# Patient Record
Sex: Female | Born: 1975 | Race: White | Hispanic: No | Marital: Single | Smoking: Former smoker
Health system: Southern US, Community
[De-identification: ages and names within clinical notes are randomized; demographics above are authoritative.]

## PROBLEM LIST (undated history)

## (undated) DIAGNOSIS — E079 Disorder of thyroid, unspecified: Secondary | ICD-10-CM

## (undated) DIAGNOSIS — D6851 Activated protein C resistance: Secondary | ICD-10-CM

## (undated) DIAGNOSIS — Z8619 Personal history of other infectious and parasitic diseases: Secondary | ICD-10-CM

## (undated) DIAGNOSIS — I2699 Other pulmonary embolism without acute cor pulmonale: Secondary | ICD-10-CM

## (undated) DIAGNOSIS — E039 Hypothyroidism, unspecified: Secondary | ICD-10-CM

## (undated) HISTORY — DX: Activated protein C resistance: D68.51

## (undated) HISTORY — DX: Hypothyroidism, unspecified: E03.9

## (undated) HISTORY — DX: Other pulmonary embolism without acute cor pulmonale: I26.99

## (undated) HISTORY — PX: NO PAST SURGERIES: SHX2092

## (undated) HISTORY — DX: Personal history of other infectious and parasitic diseases: Z86.19

---

## 2010-09-09 ENCOUNTER — Other Ambulatory Visit (HOSPITAL_COMMUNITY)
Admission: RE | Admit: 2010-09-09 | Discharge: 2010-09-09 | Disposition: A | Discharge status: Home / Self Care | Payer: BC Managed Care – PPO | Source: Ambulatory Visit | Attending: Family Medicine | Admitting: Family Medicine

## 2010-09-09 DIAGNOSIS — Z113 Encounter for screening for infections with a predominantly sexual mode of transmission: Secondary | ICD-10-CM | POA: Insufficient documentation

## 2010-09-09 DIAGNOSIS — Z01419 Encounter for gynecological examination (general) (routine) without abnormal findings: Secondary | ICD-10-CM | POA: Insufficient documentation

## 2010-09-30 ENCOUNTER — Other Ambulatory Visit (HOSPITAL_COMMUNITY)
Admission: RE | Admit: 2010-09-30 | Discharge: 2010-09-30 | Disposition: A | Discharge status: Home / Self Care | Payer: BC Managed Care – PPO | Source: Ambulatory Visit | Attending: Family Medicine | Admitting: Family Medicine

## 2010-09-30 ENCOUNTER — Other Ambulatory Visit: Payer: Self-pay | Admitting: Family Medicine

## 2010-09-30 DIAGNOSIS — Z124 Encounter for screening for malignant neoplasm of cervix: Secondary | ICD-10-CM | POA: Insufficient documentation

## 2011-03-16 ENCOUNTER — Telehealth: Payer: Self-pay | Admitting: Oncology

## 2011-03-16 NOTE — Telephone Encounter (Signed)
received a call back from pt and she confirmed appt for appt on 11/19.  will faxed a letter to Dr. Zachery Dauer office with appt d/t

## 2011-03-16 NOTE — Telephone Encounter (Signed)
Called pts lmovm to rtn call to scheduled appt for 11/19

## 2011-03-19 ENCOUNTER — Other Ambulatory Visit: Payer: Self-pay | Admitting: Oncology

## 2011-03-19 DIAGNOSIS — D6859 Other primary thrombophilia: Secondary | ICD-10-CM

## 2011-03-19 NOTE — Progress Notes (Signed)
err

## 2011-03-20 ENCOUNTER — Other Ambulatory Visit: Payer: Self-pay | Admitting: Oncology

## 2011-03-20 ENCOUNTER — Other Ambulatory Visit (HOSPITAL_BASED_OUTPATIENT_CLINIC_OR_DEPARTMENT_OTHER): Payer: BC Managed Care – PPO

## 2011-03-20 ENCOUNTER — Ambulatory Visit: Payer: BC Managed Care – PPO

## 2011-03-20 ENCOUNTER — Ambulatory Visit (HOSPITAL_BASED_OUTPATIENT_CLINIC_OR_DEPARTMENT_OTHER): Payer: BC Managed Care – PPO | Admitting: Oncology

## 2011-03-20 ENCOUNTER — Telehealth: Payer: Self-pay | Admitting: *Deleted

## 2011-03-20 VITALS — BP 142/88 | HR 73 | Temp 97.8°F | Ht 67.0 in | Wt 255.6 lb

## 2011-03-20 DIAGNOSIS — I2699 Other pulmonary embolism without acute cor pulmonale: Secondary | ICD-10-CM

## 2011-03-20 DIAGNOSIS — D72820 Lymphocytosis (symptomatic): Secondary | ICD-10-CM

## 2011-03-20 DIAGNOSIS — D6859 Other primary thrombophilia: Secondary | ICD-10-CM

## 2011-03-20 LAB — CBC WITH DIFFERENTIAL/PLATELET
Basophils Absolute: 0 10*3/uL (ref 0.0–0.1)
Eosinophils Absolute: 0.1 10*3/uL (ref 0.0–0.5)
HGB: 14.2 g/dL (ref 11.6–15.9)
MONO#: 0.5 10*3/uL (ref 0.1–0.9)
NEUT#: 4.7 10*3/uL (ref 1.5–6.5)
RBC: 4.83 10*6/uL (ref 3.70–5.45)
RDW: 13.1 % (ref 11.2–14.5)
WBC: 8.4 10*3/uL (ref 3.9–10.3)

## 2011-03-20 LAB — COMPREHENSIVE METABOLIC PANEL
ALT: 21 U/L (ref 0–35)
AST: 20 U/L (ref 0–37)
Albumin: 4.4 g/dL (ref 3.5–5.2)
Alkaline Phosphatase: 54 U/L (ref 39–117)
Chloride: 101 mEq/L (ref 96–112)
Potassium: 3.9 mEq/L (ref 3.5–5.3)
Sodium: 136 mEq/L (ref 135–145)
Total Protein: 7.2 g/dL (ref 6.0–8.3)

## 2011-03-20 LAB — PROTIME-INR
INR: 2 (ref 2.00–3.50)
Protime: 24 Seconds — ABNORMAL HIGH (ref 10.6–13.4)

## 2011-03-20 NOTE — Progress Notes (Signed)
Addended by: Pierce Crane on: 03/20/2011 02:02 PM   Modules accepted: Level of Service

## 2011-03-20 NOTE — Progress Notes (Signed)
Referral MD  Dr Juluis Rainier    Reason for Referral: Pulmonary embolism; factor V leiden deiciency  No chief complaint on file. : Hx of blood clot  HPI: Previously healthy 35 yo  Woman , originally from [place]  No past medical history on file.:  No past surgical history on file.:  Current outpatient prescriptions:warfarin (COUMADIN) 5 MG tablet, Take 5 mg by mouth daily.  , Disp: , Rfl: :    :  No Known Allergies:  No family history on file.:  History   Social History  . Marital Status: Unknown    Spouse Name: N/A    Number of Children: N/A  . Years of Education: N/A   Occupational History  . Not on file.   Social History Main Topics  . Smoking status: Not on file  . Smokeless tobacco: Not on file  . Alcohol Use: Not on file  . Drug Use: Not on file  . Sexually Active: Not on file   Other Topics Concern  . Not on file   Social History Narrative  . No narrative on file  :  Pertinent items are noted in HPI.  Exam: @IPVITALS @ General appearance: alert   Basename 03/20/11 1055  WBC 8.4  HGB 14.2  HCT 41.6  PLT 262   No results found for this basename: NA:2,K:2,CL:2,CO2:2,GLUCOSE:2,BUN:2,CREATININE:2,CALCIUM:2 in the last 72 hours  Blood smear review: n/a  Pathology:n/a  No results found.  Assessment and Plan: n/a

## 2011-03-20 NOTE — Telephone Encounter (Signed)
Gave patient appointment for [phone]

## 2011-03-20 NOTE — Progress Notes (Signed)
Referral MD  N/ADr Juluis Rainier   Reason for Referral: History of pulmonary embolism and factor V Leiden deficiency    HPI: This is a 35 year old previously healthy single woman who presents with a pulmonary embolism. She apparently was driving to [place] on a Saturday morning at. A Saturday afternoon she began to experience some right costochondral pain in the early morning hours of Sunday she awoke with more pain and sought medical attention. A [facility] [place] showed large, embolism within the distal right pulmonary artery and right lower pulmonary artery extension into the lateral posterior basilar segment of the pulmonary arterial branches the echocardiogram showed a normal ejection fraction, with mild tricuspid regurgitation and mild pulmonary hypertension she was admitted and subsequently given Lovenox injections followed by Coumadin therapy. She is currently on 5 mg of Coumadin her most recent INR, today, was 2 she feels much better. She has some residual pain in the right side of her chest, with no shortness of breath she is back at work   No past medical history on file.:   No past surgical history on file.:   Current outpatient prescriptions:warfarin (COUMADIN) 5 MG tablet, Take 5 mg by mouth daily.  , Disp: , Rfl: :    Social history:  The patient is single. She is usually from [place] where she was raised went to school in [place]. 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 She does not currently smoke but did smoke for about 5 years about a pack per week she does not currently consume alcohol and has no known allergies   Family history : She has a half brother who is well, her mother has a history of arthritis. There is no history of blood clots in the family.   Obstetrical history : He is a G2 P2, she had 1 miscarriage and one stillbirth. The stillbirth was about year  2000. He was on a birth control pill up until about a month ago and had been on birth control pills since the age of 35.   A comprehensive review of systems was negative.  Exam: @IPVITALS @ General appearance: alert, cooperative and appears stated age Head and neck exam : Normal  Nodes : Negative Chest : Negative  CVS: Negative  Abdomen: Negative  Ext: Negative  CNS: Negative    Basename 03/20/11 1055  WBC 8.4  HGB 14.2  HCT 41.6  PLT 262   No results found for this basename: NA:2,K:2,CL:2,CO2:2,GLUCOSE:2,BUN:2,CREATININE:2,CALCIUM:2 in the last 72 hours  Blood smear review: n/a  Pathology:n/a  No results found.  Assessment and Plan: This is a pleasant 35 year old woman who recently traveled to [place] and suffered a pulmonary embolism. She happened to be found to be heterozygous for the factor V Leiden mutation as well. No other apparent abnormalities were seen on the hypercoagulable workup. She has some other coexisting risk factors for blood clots 1. History of birth control pill use  2. Obesity and 3. Prolonged car ride. I recommended that she keep her INR between 2.5 and 3. In addition I have recommended that she continue Coumadin therapy for a year. There are no other recommendations regarding prolonged Coumadin treatment beyond that period of time. I've also recommended that she consider some form of parenteral anticoagulation if she is undergoing any form of surgery and/or if she becomes pregnant. Relative risk of blood clots increases logarithmily  if  she does take a birth control pill or becomes pregnant. I  recommended that she lose weight. Being a carrier for the factor V Leiden deficiency, posterior at higher risk for premature abortions and/or miscarriages.  I will plan to see her in 3 months time, and likely repeat a hypercoagulable workup when she completes her year of Coumadin therapy. Some data to suggest that d-dimer evaluations can help  protect your length of Coumadin treatment.  I enjoyed meeting with this patient. Greater than 50% of the time was spent with patient-related counseling   Pierce Crane M.D., [place].

## 2011-06-21 ENCOUNTER — Ambulatory Visit: Payer: BC Managed Care – PPO | Admitting: Oncology

## 2011-06-21 ENCOUNTER — Ambulatory Visit (HOSPITAL_BASED_OUTPATIENT_CLINIC_OR_DEPARTMENT_OTHER): Payer: BC Managed Care – PPO | Admitting: Lab

## 2011-06-21 ENCOUNTER — Telehealth: Payer: Self-pay | Admitting: *Deleted

## 2011-06-21 ENCOUNTER — Other Ambulatory Visit (HOSPITAL_BASED_OUTPATIENT_CLINIC_OR_DEPARTMENT_OTHER): Payer: BC Managed Care – PPO | Admitting: Lab

## 2011-06-21 VITALS — BP 129/87 | HR 75 | Temp 97.9°F | Ht 67.0 in | Wt 257.0 lb

## 2011-06-21 DIAGNOSIS — Z7901 Long term (current) use of anticoagulants: Secondary | ICD-10-CM

## 2011-06-21 DIAGNOSIS — D6859 Other primary thrombophilia: Secondary | ICD-10-CM

## 2011-06-21 DIAGNOSIS — I2699 Other pulmonary embolism without acute cor pulmonale: Secondary | ICD-10-CM

## 2011-06-21 NOTE — Progress Notes (Signed)
Hematology and Oncology Follow Up Visit  Alicia Frost [id number] 07-Jun-1975 35 y.o. 06/21/2011 9:34 AM PCP Dr Juluis Rainier  Principle Diagnosis: Factor V Leiden deficiency, history of pulmonary embolism October 2012 on Coumadin therapy  Interim History:  There have been no intercurrent illness, hospitalizations or medication changes. The lesion is been doing well she's had no bleeding or bruising. I have reviewed her INR is over the past 4 months. They are in the therapeutic range however there are a number of occasions when her INR is below 2 range.  Medications: I have reviewed the patient's current medications.  Allergies: No Known Allergies  Past Medical History, Surgical history, Social history, and Family History were reviewed and updated.  Review of Systems: Constitutional:  Negative for fever, chills, night sweats, anorexia, weight loss, pain. Cardiovascular: no chest pain or dyspnea on exertion Respiratory: negative Neurological: negative Dermatological: negative ENT: negative Skin Gastrointestinal: no abdominal pain, change in bowel habits, or black or bloody stools Genito-Urinary: negative Hematological and Lymphatic: negative Breast: negative Musculoskeletal: negative Remaining ROS negative.  Physical Exam: Blood pressure 129/87, pulse 75, temperature 97.9 F (36.6 C), temperature source Oral, height 5\' 7"  (1.702 m), weight 257 lb (116.574 kg). ECOG:  General appearance: alert, cooperative and appears stated age Head: Normocephalic, without obvious abnormality, atraumatic Neck: no adenopathy, no carotid bruit, no JVD, supple, symmetrical, trachea midline and thyroid not enlarged, symmetric, no tenderness/mass/nodules Lymph nodes: Cervical, supraclavicular, and axillary nodes normal. Cardiac : regular rate and rhythm, no murmurs or gallops Pulmonary:clear to auscultation bilaterally Breasts: inspection negative, no nipple discharge or bleeding, no masses or  nodularity palpable Abdomen:soft, non-tender; bowel sounds normal; no masses,  no organomegaly Extremities negative Neuro: alert, oriented, normal speech, no focal findings or movement disorder noted  Lab Results: Lab Results  Component Value Date   WBC 8.4 03/20/2011   HGB 14.2 03/20/2011   HCT 41.6 03/20/2011   MCV 86.3 03/20/2011   PLT 262 03/20/2011     Chemistry      Component Value Date/Time   NA 136 03/20/2011 1055   K 3.9 03/20/2011 1055   CL 101 03/20/2011 1055   CO2 25 03/20/2011 1055   BUN 11 03/20/2011 1055   CREATININE 0.86 03/20/2011 1055      Component Value Date/Time   CALCIUM 9.0 03/20/2011 1055   ALKPHOS 54 03/20/2011 1055   AST 20 03/20/2011 1055   ALT 21 03/20/2011 1055   BILITOT 0.4 03/20/2011 1055      .pathology. Radiological Studies: chest X-ray n/a Mammogram n/a Bone density n/a  Impression and Plan:  Patient is doing well on Coumadin. We had a discussion regarding increasing her Coumadin dose to try to get her INR between 2.5 and 3. She is not having problems of bleeding or or bruising. She is doing working full-time and doing otherwise well. I discussed the long-term prospect of keeping on Coumadin for total of a year. We also discussed the issue of a taking Lovenox for surgeries as well as prior to any long airplane flights. I also advise that she get a medical alert bracelet. I will see her in 6 months time  More than 50% of the visit was spent in patient-related counselling   Pierce Crane, MD 2/20/20139:34 AM

## 2011-06-21 NOTE — Telephone Encounter (Signed)
gave patient appointment for 11-2011 sent patient back to the lab on 06-21-2011 per orders from dr.rubin

## 2011-12-07 ENCOUNTER — Other Ambulatory Visit: Payer: Self-pay | Admitting: Family Medicine

## 2011-12-07 DIAGNOSIS — R609 Edema, unspecified: Secondary | ICD-10-CM

## 2011-12-08 ENCOUNTER — Ambulatory Visit
Admission: RE | Admit: 2011-12-08 | Discharge: 2011-12-08 | Disposition: A | Discharge status: Home / Self Care | Payer: BC Managed Care – PPO | Source: Ambulatory Visit | Attending: Family Medicine | Admitting: Family Medicine

## 2011-12-08 DIAGNOSIS — R609 Edema, unspecified: Secondary | ICD-10-CM

## 2011-12-14 ENCOUNTER — Telehealth: Payer: Self-pay | Admitting: *Deleted

## 2011-12-14 NOTE — Telephone Encounter (Signed)
left messge to inform the patient of the new date and time on 01-31-2012 starting at 2:00pm with labs

## 2011-12-15 ENCOUNTER — Telehealth: Payer: Self-pay | Admitting: Oncology

## 2011-12-15 NOTE — Telephone Encounter (Signed)
Returned pt's call and s/w pt re appt for 10/2.

## 2011-12-19 ENCOUNTER — Ambulatory Visit: Payer: BC Managed Care – PPO | Admitting: Oncology

## 2011-12-19 ENCOUNTER — Other Ambulatory Visit: Payer: BC Managed Care – PPO | Admitting: Lab

## 2012-01-24 ENCOUNTER — Telehealth: Payer: Self-pay | Admitting: *Deleted

## 2012-01-24 NOTE — Telephone Encounter (Signed)
per md rescheduled patient for 03-11-2012 starting at 2:00pm patient confirmed over the phone the new date and time

## 2012-01-31 ENCOUNTER — Ambulatory Visit: Payer: BC Managed Care – PPO | Admitting: Oncology

## 2012-01-31 ENCOUNTER — Other Ambulatory Visit: Payer: BC Managed Care – PPO | Admitting: Lab

## 2012-03-11 ENCOUNTER — Ambulatory Visit (HOSPITAL_BASED_OUTPATIENT_CLINIC_OR_DEPARTMENT_OTHER): Payer: BC Managed Care – PPO | Admitting: Lab

## 2012-03-11 ENCOUNTER — Other Ambulatory Visit (HOSPITAL_BASED_OUTPATIENT_CLINIC_OR_DEPARTMENT_OTHER): Payer: BC Managed Care – PPO | Admitting: Lab

## 2012-03-11 ENCOUNTER — Telehealth: Payer: Self-pay | Admitting: Oncology

## 2012-03-11 ENCOUNTER — Ambulatory Visit (HOSPITAL_BASED_OUTPATIENT_CLINIC_OR_DEPARTMENT_OTHER): Payer: BC Managed Care – PPO | Admitting: Oncology

## 2012-03-11 VITALS — BP 120/85 | HR 76 | Temp 98.1°F | Resp 20 | Ht 67.0 in | Wt 248.0 lb

## 2012-03-11 DIAGNOSIS — D6859 Other primary thrombophilia: Secondary | ICD-10-CM

## 2012-03-11 DIAGNOSIS — Z86711 Personal history of pulmonary embolism: Secondary | ICD-10-CM

## 2012-03-11 LAB — PROTIME-INR: INR: 3.7 — ABNORMAL HIGH (ref 2.00–3.50)

## 2012-03-11 NOTE — Telephone Encounter (Signed)
gve the pt her march 2014 appt calendar °

## 2012-03-11 NOTE — Progress Notes (Signed)
Hematology and Oncology Follow Up Visit  Alicia Frost [id number] Oct 06, 1975 36 y.o. 03/11/2012 3:15 PM PCP Dr Juluis Rainier  Principle Diagnosis: Factor V Leiden deficiency, history of pulmonary embolism October 2012 on Coumadin therapy  Interim History:  There have been no intercurrent illness, hospitalizations or medication changes. The lesion is been doing well she's had no bleeding or bruising. She continues on 10 mg of Coumadin per day. Overall her INR is in the 2.5-3 range. She is currently your of active chemotherapy after her blood clot. In August of this year she did have a repeat Doppler ultrasound she was concerned about a repeat clot. She otherwise is doing well. Medications: I have reviewed the patient's current medications.  Allergies: No Known Allergies  Past Medical History, Surgical history, Social history, and Family History were reviewed and updated.  Review of Systems: Constitutional:  Negative for fever, chills, night sweats, anorexia, weight loss, pain. Cardiovascular: no chest pain or dyspnea on exertion Respiratory: negative Neurological: negative Dermatological: negative ENT: negative Skin Gastrointestinal: no abdominal pain, change in bowel habits, or black or bloody stools Genito-Urinary: negative Hematological and Lymphatic: negative Breast: negative Musculoskeletal: negative Remaining ROS negative.  Physical Exam: Blood pressure 120/85, pulse 76, temperature 98.1 F (36.7 C), resp. rate 20, height 5\' 7"  (1.702 m), weight 248 lb (112.492 kg). ECOG: 0 General appearance: alert, cooperative and appears stated age Head: Normocephalic, without obvious abnormality, atraumatic Neck: no adenopathy, no carotid bruit, no JVD, supple, symmetrical, trachea midline and thyroid not enlarged, symmetric, no tenderness/mass/nodules Lymph nodes: Cervical, supraclavicular, and axillary nodes normal. Cardiac : regular rate and rhythm, no murmurs or  gallops Pulmonary:clear to auscultation bilaterally  Abdomen:soft, non-tender; bowel sounds normal; no masses,  no organomegaly Extremities negative Neuro: alert, oriented, normal speech, no focal findings or movement disorder noted  Lab Results: Lab Results  Component Value Date   WBC 8.4 03/20/2011   HGB 14.2 03/20/2011   HCT 41.6 03/20/2011   MCV 86.3 03/20/2011   PLT 262 03/20/2011     Chemistry      Component Value Date/Time   NA 136 03/20/2011 1055   K 3.9 03/20/2011 1055   CL 101 03/20/2011 1055   CO2 25 03/20/2011 1055   BUN 11 03/20/2011 1055   CREATININE 0.86 03/20/2011 1055      Component Value Date/Time   CALCIUM 9.0 03/20/2011 1055   ALKPHOS 54 03/20/2011 1055   AST 20 03/20/2011 1055   ALT 21 03/20/2011 1055   BILITOT 0.4 03/20/2011 1055      .pathology. Radiological Studies: chest X-ray n/a Mammogram n/a Bone density n/a  Impression and Plan:  Patient is doing well on Coumadin. Whether fairly long discussion today about the the course of therapy . I believe she should stay on Coumadin for the time being given her history of PE. At that time she was on birth control pill was smoking and was overweight. She is still somewhat overweight currently is intending on losing some weight. We also discussed the fact that she is interested in becoming pregnant in the next 6 months to year. At the present time I think that she would remain on Coumadin until I see her again in followup in about 4-6 months. At that time she is planning on becoming pregnant we can stop her Coumadin and begin her on prophylactic Lovenox daily. Of note is that her on her today 3.7 we will communicate information to her primary care Dr. More than 50% of the visit was  spent in patient-related counselling   Pierce Crane, MD 11/11/20133:15 PM

## 2012-06-01 ENCOUNTER — Encounter: Payer: Self-pay | Admitting: Oncology

## 2012-06-01 ENCOUNTER — Telehealth: Payer: Self-pay | Admitting: Oncology

## 2012-06-01 NOTE — Telephone Encounter (Signed)
Former pt of [facility] reassigned to [facility]. S/w pt today re new provider and appt d/t. Letter mailed.

## 2012-07-10 ENCOUNTER — Ambulatory Visit: Payer: BC Managed Care – PPO | Admitting: Oncology

## 2012-07-10 ENCOUNTER — Other Ambulatory Visit: Payer: BC Managed Care – PPO | Admitting: Lab

## 2012-07-11 ENCOUNTER — Telehealth: Payer: Self-pay | Admitting: *Deleted

## 2012-07-11 NOTE — Telephone Encounter (Signed)
lm appts d/t was given upon her request. Appt was made for 07/29/2012.

## 2012-07-26 ENCOUNTER — Other Ambulatory Visit: Payer: Self-pay | Admitting: *Deleted

## 2012-07-26 DIAGNOSIS — D6859 Other primary thrombophilia: Secondary | ICD-10-CM

## 2012-07-29 ENCOUNTER — Telehealth: Payer: Self-pay | Admitting: Oncology

## 2012-07-29 ENCOUNTER — Encounter: Payer: Self-pay | Admitting: Oncology

## 2012-07-29 ENCOUNTER — Ambulatory Visit (HOSPITAL_BASED_OUTPATIENT_CLINIC_OR_DEPARTMENT_OTHER): Payer: BC Managed Care – PPO | Admitting: Oncology

## 2012-07-29 ENCOUNTER — Other Ambulatory Visit (HOSPITAL_BASED_OUTPATIENT_CLINIC_OR_DEPARTMENT_OTHER): Payer: BC Managed Care – PPO | Admitting: Lab

## 2012-07-29 VITALS — BP 146/79 | HR 76 | Temp 97.8°F | Resp 20 | Ht 67.0 in | Wt 244.2 lb

## 2012-07-29 DIAGNOSIS — D6859 Other primary thrombophilia: Secondary | ICD-10-CM

## 2012-07-29 DIAGNOSIS — D6851 Activated protein C resistance: Secondary | ICD-10-CM | POA: Insufficient documentation

## 2012-07-29 DIAGNOSIS — Z86711 Personal history of pulmonary embolism: Secondary | ICD-10-CM

## 2012-07-29 DIAGNOSIS — I2699 Other pulmonary embolism without acute cor pulmonale: Secondary | ICD-10-CM

## 2012-07-29 HISTORY — DX: Activated protein C resistance: D68.51

## 2012-07-29 HISTORY — DX: Other pulmonary embolism without acute cor pulmonale: I26.99

## 2012-07-29 LAB — PROTIME-INR: Protime: 25.2 Seconds — ABNORMAL HIGH (ref 10.6–13.4)

## 2012-07-29 LAB — CBC WITH DIFFERENTIAL/PLATELET
BASO%: 0.5 % (ref 0.0–2.0)
Basophils Absolute: 0 10*3/uL (ref 0.0–0.1)
EOS%: 0.9 % (ref 0.0–7.0)
HCT: 42.1 % (ref 34.8–46.6)
HGB: 14.1 g/dL (ref 11.6–15.9)
MCH: 29.1 pg (ref 25.1–34.0)
MONO#: 0.5 10*3/uL (ref 0.1–0.9)
NEUT%: 60.3 % (ref 38.4–76.8)
RDW: 13.3 % (ref 11.2–14.5)
WBC: 8.1 10*3/uL (ref 3.9–10.3)
lymph#: 2.5 10*3/uL (ref 0.9–3.3)

## 2012-07-29 NOTE — Progress Notes (Signed)
Hematology and Oncology Follow Up Visit  Alicia Frost [id number] 1976/01/21 37 y.o. 07/29/2012 7:38 PM   Principle Diagnosis: Encounter Diagnoses  Name Primary?  . Heterozygous factor V Leiden mutation Yes  . Pulmonary embolus      Interim History:  I will be assuming this patient's hematology care since Dr. Donnie Coffin left the practice.  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 She developed a right pulmonary embolus in October 2012. She was found to be a heterozygote for the factor V Leiden gene mutation. Of note, she was on oral contraceptive at the time of her PE which is a known risk factor magnifying clotting risk significantly in patients with the Leiden gene mutation. She has been on Coumadin anticoagulation since that time. Additional history reveals a stillborn child at full term when she was 6 years old at the time of her first pregnancy. She had an early miscarriage of a second pregnancy at about [redacted] weeks gestation. She is considering getting pregnant in the future but is not actively trying to conceive at this time. She has a steady female companion.  She has no other chronic medical problems and denies hypertension, diabetes, ulcers, asthma, thyroid trouble, seizures, stroke, and she has had no prior major surgeries.  Medications: Current Coumadin dose 10 mg daily except 5 mg on Sundays; she is on no other medications  Her mother was tested for 5 Leiden and was negative. She does not know much about her biologic father. A maternal half brother age 85 has not been checked.  Allergies: No Known Allergies  Review of Systems: Constitutional:  No constitutional symptoms  Respiratory: No cough or dyspnea Cardiovascular:  No chest pain or palpitations Gastrointestinal: No change in bowel habit Genito-Urinary: Regular menstrual cycles Musculoskeletal: No arthritis condition Neurologic: No headache or change in vision Skin: Remaining ROS  negative.  Physical Exam: Blood pressure 146/79, pulse 76, temperature 97.8 F (36.6 C), temperature source Oral, resp. rate 20, height 5\' 7"  (1.702 m), weight 244 lb 3.2 oz (110.768 kg). Wt Readings from Last 3 Encounters:  07/29/12 244 lb 3.2 oz (110.768 kg)  03/11/12 248 lb (112.492 kg)  06/21/11 257 lb (116.574 kg)     General appearance: Overweight Caucasian woman HENNT: Pharynx no erythema or exudate Lymph nodes: No adenopathy Breasts: Lungs: Clear to auscultation resonant to percussion Heart: Regular rhythm no murmur Abdomen: Soft, nontender, no mass, no organomegaly Extremities: No edema, no calf tenderness Vascular: No cyanosis Neurologic: Mental status intact, PERRLA, optic disc sharp and vessels normal, motor strength 5 over 5, reflexes absent symmetric at the knees, 1+ symmetric at the biceps Skin: No rash or ecchymosis  Lab Results: Lab Results  Component Value Date   WBC 8.1 07/29/2012   HGB 14.1 07/29/2012   HCT 42.1 07/29/2012   MCV 87.0 07/29/2012   PLT 185 07/29/2012     Chemistry      Component Value Date/Time   NA 136 03/20/2011 1055   K 3.9 03/20/2011 1055   CL 101 03/20/2011 1055   CO2 25 03/20/2011 1055   BUN 11 03/20/2011 1055   CREATININE 0.86 03/20/2011 1055      Component Value Date/Time   CALCIUM 9.0 03/20/2011 1055   ALKPHOS 54 03/20/2011 1055   AST 20 03/20/2011 1055   ALT 21 03/20/2011 1055   BILITOT 0.4 03/20/2011 1055     Impression and Plan: #1. Clotting tendency due to underlying heterozygote status for the factor V Leiden gene  mutation.  She has now been on anticoagulation for over one year. I think it is safe to stop her Coumadin. She does not know if she was ever checked for any other congenital coagulopathies such as protein S or protein C deficiency. I told her to try to obtain records from the hospital in [place] where she was treated for her initial event. I do not want to repeat tests that have already been done but  I do think it's important to establish a data base and exclude other possibilities.  I told her if she does become pregnant, I would put her on prophylactic dose Lovenox  And ask her gynecologist to schedule a planned induction of labor so that the Lovenox could be stopped 24 hours before use of any epidural anesthetic. I suggested she get a referral to an obstetrician gynecologist at this time.  It is really not clear why she had a stillborn child. If there is any association of factor V Leiden and pregnancy loss it does appear to the limited to the third trimester so it is possible that this was a risk factor. There does not appear to be an association with early miscarriage and 5 Leiden.  CC:. Dr. Juluis Rainier   Levert Feinstein, MD 3/31/20147:38 PM

## 2012-07-30 ENCOUNTER — Telehealth: Payer: Self-pay | Admitting: Oncology

## 2012-07-30 NOTE — Telephone Encounter (Signed)
Fax medical records to Dr. Juluis Rainier office for Dr. Cyndie Chime

## 2012-12-11 ENCOUNTER — Other Ambulatory Visit: Payer: Self-pay | Admitting: Family Medicine

## 2012-12-11 ENCOUNTER — Other Ambulatory Visit (HOSPITAL_COMMUNITY)
Admission: RE | Admit: 2012-12-11 | Discharge: 2012-12-11 | Disposition: A | Discharge status: Home / Self Care | Payer: BC Managed Care – PPO | Source: Ambulatory Visit | Attending: Family Medicine | Admitting: Family Medicine

## 2012-12-11 DIAGNOSIS — Z124 Encounter for screening for malignant neoplasm of cervix: Secondary | ICD-10-CM | POA: Insufficient documentation

## 2013-01-08 ENCOUNTER — Telehealth: Payer: Self-pay | Admitting: Oncology

## 2013-01-08 NOTE — Telephone Encounter (Signed)
called pt ,left message regarding appt with Dr. Reece Agar on 9/22, r/s to 02/24/13 due to call day

## 2013-01-20 ENCOUNTER — Ambulatory Visit: Payer: BC Managed Care – PPO | Admitting: Oncology

## 2013-02-24 ENCOUNTER — Telehealth: Payer: Self-pay | Admitting: Oncology

## 2013-02-24 ENCOUNTER — Ambulatory Visit (HOSPITAL_BASED_OUTPATIENT_CLINIC_OR_DEPARTMENT_OTHER): Payer: BC Managed Care – PPO | Admitting: Oncology

## 2013-02-24 ENCOUNTER — Encounter (INDEPENDENT_AMBULATORY_CARE_PROVIDER_SITE_OTHER): Payer: Self-pay

## 2013-02-24 VITALS — BP 119/76 | HR 66 | Temp 97.0°F | Resp 18 | Ht 67.0 in | Wt 243.8 lb

## 2013-02-24 DIAGNOSIS — D6851 Activated protein C resistance: Secondary | ICD-10-CM

## 2013-02-24 DIAGNOSIS — D6859 Other primary thrombophilia: Secondary | ICD-10-CM

## 2013-02-24 DIAGNOSIS — E039 Hypothyroidism, unspecified: Secondary | ICD-10-CM

## 2013-02-24 DIAGNOSIS — I2699 Other pulmonary embolism without acute cor pulmonale: Secondary | ICD-10-CM

## 2013-02-24 DIAGNOSIS — N96 Recurrent pregnancy loss: Secondary | ICD-10-CM

## 2013-02-24 DIAGNOSIS — E069 Thyroiditis, unspecified: Secondary | ICD-10-CM

## 2013-02-24 NOTE — Telephone Encounter (Signed)
lmonvm advising the pt of her appt with dr Cyndie Chime in jan 2015.

## 2013-02-25 NOTE — Progress Notes (Signed)
Hematology and Oncology Follow Up Visit  Alicia Frost [id number] December 24, 1975 37 y.o. 02/25/2013 8:04 AM   Principle Diagnosis: Encounter Diagnoses  Name Primary?  . Hypercoagulable state Yes  . Heterozygous factor V Leiden mutation   . 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 She has a history of a right pulmonary embolus diagnosed October 2012. She was on oral contraceptives at the time of the event. She was found to be a heterozygote for the factor V Leiden gene mutation. History also showed 2 pregnancy losses. The first was a stillborn at full term when she was 37 years old. The second was a miscarriage at [redacted] weeks gestation and her second pregnancy. At the time that I saw her, she had a previous clotting evaluation outside of [place] but did not have the data. She was fully anticoagulated with Coumadin for over one year. I felt it would be safe for her to stop the Coumadin since she still had a desire to get pregnant. In view of her previous history I recommended starting prophylactic dose Lovenox in the event of a pregnancy. She tells me that she did get pregnant again very recently but unfortunately had another miscarriage at 7 weeks. She had just started the Lovenox injections. In addition, she was recently found to have a thyroid condition. She states her thyroid functions fluctuated from hyperthyroidism to hypothyroidism. She saw Dr. Talmage Coin. It is likely that she had thyroiditis. She was put on Synthroid. This is another factor that may be related to her pregnancy loss.  She has not had any dyspnea, chest pain, palpitations, leg swelling or pain.    Medications: reviewed  Allergies: No Known Allergies  Review of Systems: See history of present illness Remaining ROS negative.  Physical Exam: Blood pressure 119/76, pulse 66, temperature 97 F (36.1 C),  temperature source Oral, resp. rate 18, height 5\' 7"  (1.702 m), weight 243 lb 12.8 oz (110.587 kg), SpO2 98.00%. Wt Readings from Last 3 Encounters:  02/24/13 243 lb 12.8 oz (110.587 kg)  07/29/12 244 lb 3.2 oz (110.768 kg)  03/11/12 248 lb (112.492 kg)     General appearance: Well-nourished Caucasian woman Lymph nodes: No cervical, supraclavicular, or axillary lymphadenopathy Lungs: Clear to auscultation, resonant to percussion throughout Heart: Regular rhythm, no murmur, no gallop, no rub, no click, no edema Extremities: No edema, no calf tenderness Vascular: Carotid pulses 2+, no bruits,   Lab Results: CBC W/Diff    Component Value Date/Time   WBC 8.1 07/29/2012 1445   RBC 4.84 07/29/2012 1445   HGB 14.1 07/29/2012 1445   HCT 42.1 07/29/2012 1445   PLT 185 07/29/2012 1445   MCV 87.0 07/29/2012 1445   MCH 29.1 07/29/2012 1445   MCHC 33.5 07/29/2012 1445   RDW 13.3 07/29/2012 1445   LYMPHSABS 2.5 07/29/2012 1445   MONOABS 0.5 07/29/2012 1445   EOSABS 0.1 07/29/2012 1445   BASOSABS 0.0 07/29/2012 1445     Chemistry      Component Value Date/Time   NA 136 03/20/2011 1055   K 3.9 03/20/2011 1055   CL 101 03/20/2011 1055   CO2 25 03/20/2011 1055   BUN 11 03/20/2011 1055   CREATININE 0.86 03/20/2011 1055      Component Value Date/Time   CALCIUM 9.0 03/20/2011 1055   ALKPHOS 54 03/20/2011 1055   AST 20 03/20/2011 1055  ALT 21 03/20/2011 1055   BILITOT 0.4 03/20/2011 1055     Impression:  #1. Pulmonary embolus related to previous use of oral contraceptives with magnified risk secondary to finding of heterozygote status for the factor V Leiden gene mutation.  #2. Recurrent pregnancy loss. This is unlikely to be secondary to the factor V Leiden gene mutation. I again encouraged her to get previous data from her coagulation evaluation. I think we really need to know whether or not she has a positive lupus anticoagulant or the presence of antiphospholipid antibodies. I told her to  start aspirin 81 mg daily to take on a chronic basis and resume Lovenox 40 mg subcutaneous daily if she gets pregnant again.  #3. New diagnosis thyroiditis/hypothyroidism   CC: Patient Care Team: Mauro Kaufmann, RN as PCP - General Dr. Juluis Rainier Dr. Talmage Coin  Over 25 minutes spent with this patient with greater than 90% in counseling.  Levert Feinstein, MD 10/28/20148:04 AM

## 2013-05-26 ENCOUNTER — Ambulatory Visit: Payer: BC Managed Care – PPO | Admitting: Oncology

## 2013-06-29 ENCOUNTER — Encounter: Payer: Self-pay | Admitting: Oncology

## 2013-07-17 ENCOUNTER — Other Ambulatory Visit: Payer: Self-pay | Admitting: Family Medicine

## 2013-07-17 ENCOUNTER — Ambulatory Visit
Admission: RE | Admit: 2013-07-17 | Discharge: 2013-07-17 | Disposition: A | Discharge status: Home / Self Care | Payer: BC Managed Care – PPO | Source: Ambulatory Visit | Attending: Family Medicine | Admitting: Family Medicine

## 2013-07-17 DIAGNOSIS — R52 Pain, unspecified: Secondary | ICD-10-CM

## 2013-09-18 ENCOUNTER — Other Ambulatory Visit: Payer: Self-pay | Admitting: *Deleted

## 2013-09-18 DIAGNOSIS — R002 Palpitations: Secondary | ICD-10-CM

## 2013-09-23 ENCOUNTER — Encounter: Payer: Self-pay | Admitting: *Deleted

## 2013-09-23 ENCOUNTER — Encounter (INDEPENDENT_AMBULATORY_CARE_PROVIDER_SITE_OTHER): Payer: BC Managed Care – PPO

## 2013-09-23 DIAGNOSIS — R002 Palpitations: Secondary | ICD-10-CM

## 2013-09-23 NOTE — Progress Notes (Signed)
Patient ID: Alicia Frost, female   DOB: 25-Nov-1975, 38 y.o.   MRN: [id number] E-Cardio 48 hour holter monitor applied to patient.

## 2013-12-17 ENCOUNTER — Other Ambulatory Visit: Payer: Self-pay | Admitting: Obstetrics and Gynecology

## 2013-12-17 ENCOUNTER — Other Ambulatory Visit (HOSPITAL_COMMUNITY)
Admission: RE | Admit: 2013-12-17 | Discharge: 2013-12-17 | Disposition: A | Discharge status: Home / Self Care | Payer: BC Managed Care – PPO | Source: Ambulatory Visit | Attending: Obstetrics and Gynecology | Admitting: Obstetrics and Gynecology

## 2013-12-17 DIAGNOSIS — Z01419 Encounter for gynecological examination (general) (routine) without abnormal findings: Secondary | ICD-10-CM | POA: Diagnosis present

## 2013-12-17 DIAGNOSIS — Z1151 Encounter for screening for human papillomavirus (HPV): Secondary | ICD-10-CM | POA: Diagnosis present

## 2013-12-19 LAB — CYTOLOGY - PAP

## 2014-02-23 LAB — OB RESULTS CONSOLE ABO/RH: RH TYPE: POSITIVE

## 2014-02-23 LAB — OB RESULTS CONSOLE ANTIBODY SCREEN: Antibody Screen: NEGATIVE

## 2014-02-23 LAB — OB RESULTS CONSOLE GC/CHLAMYDIA
Chlamydia: NEGATIVE
GC PROBE AMP, GENITAL: NEGATIVE

## 2014-02-23 LAB — OB RESULTS CONSOLE RPR: RPR: NONREACTIVE

## 2014-02-23 LAB — OB RESULTS CONSOLE RUBELLA ANTIBODY, IGM: Rubella: IMMUNE

## 2014-02-23 LAB — OB RESULTS CONSOLE HIV ANTIBODY (ROUTINE TESTING): HIV: NONREACTIVE

## 2014-02-23 LAB — OB RESULTS CONSOLE HEPATITIS B SURFACE ANTIGEN: HEP B S AG: NEGATIVE

## 2014-09-01 ENCOUNTER — Emergency Department (INDEPENDENT_AMBULATORY_CARE_PROVIDER_SITE_OTHER)
Admission: EM | Admit: 2014-09-01 | Discharge: 2014-09-01 | Disposition: A | Discharge status: Home / Self Care | Payer: BLUE CROSS/BLUE SHIELD | Source: Home / Self Care | Attending: Emergency Medicine | Admitting: Emergency Medicine

## 2014-09-01 ENCOUNTER — Encounter: Payer: Self-pay | Admitting: *Deleted

## 2014-09-01 DIAGNOSIS — J029 Acute pharyngitis, unspecified: Secondary | ICD-10-CM

## 2014-09-01 HISTORY — DX: Disorder of thyroid, unspecified: E07.9

## 2014-09-01 LAB — POCT RAPID STREP A (OFFICE): Rapid Strep A Screen: NEGATIVE

## 2014-09-01 NOTE — ED Provider Notes (Signed)
CSN: [id number]     Arrival date & time 09/01/14  [id number] History   First MD Initiated Contact with Patient 09/01/14 [phone]     Chief Complaint  Patient presents with  . Sore Throat    HPI SORE THROAT Onset: 1 days    Severity: moderate Tried OTC meds without significant relief.  Symptoms:  No definite Fever . No chills or sweats. No Swollen neck glands No Recent Strep Exposure     No Myalgias No Headache No Rash  No Discolored Nasal Mucus No Allergy symptoms No sinus pain/pressure No itchy/red eyes Mild right earache  No Drooling No Trismus  She is [redacted] weeks pregnant. Has had occasional nausea and vomiting and occasional loose nonbloody stools throughout pregnancy, but she states not significant. No vomiting or loose stool this morning. She is tolerating by mouth's.--She has an antinausea med on hand that was described by OB in case she needs it No Abdominal pain  No Reflux symptoms  No Cough No Breathing Difficulty No Shortness of Breath No pleuritic pain No Wheezing No Hemoptysis   Past Medical History  Diagnosis Date  . Heterozygous factor V Leiden mutation 07/29/2012    Dx at time of acute PE 01/2011  . Pulmonary embolus 07/29/2012    10/12  On OC; found to be V Leiden heterozygote  . Thyroid disease    History reviewed. No pertinent past surgical history. History reviewed. No pertinent family history. History  Substance Use Topics  . Smoking status: Former Games developermoker  . Smokeless tobacco: Not on file  . Alcohol Use: No   OB History    Gravida Para Term Preterm AB TAB SAB Ectopic Multiple Living   1              Review of Systems Remainder of Review of Systems negative for acute change except as noted in the HPI.  Allergies  Review of patient's allergies indicates no known allergies.  Home Medications   Prior to Admission medications   Medication Sig Start Date End Date Taking? Authorizing Provider  aspirin 81 MG tablet Take 81 mg by mouth daily.    Yes Historical Provider, MD  Cholecalciferol (VITAMIN D-3 PO) Take by mouth.   Yes Historical Provider, MD  enoxaparin (LOVENOX) 40 MG/0.4ML injection Inject 40 mg into the skin daily.   Yes Historical Provider, MD  Prenatal Vit-Fe Fumarate-FA (MULTIVITAMIN-PRENATAL) 27-0.8 MG TABS tablet Take 1 tablet by mouth daily at 12 noon.   Yes Historical Provider, MD  SYNTHROID 175 MCG tablet Take 137 mcg by mouth daily.  01/31/13   Historical Provider, MD   BP 127/82 mmHg  Pulse 93  Temp(Src) 97.7 F (36.5 C) (Oral)  Resp 16  Ht 5\' 8"  (1.727 m)  Wt 259 lb (117.482 kg)  BMI 39.39 kg/m2  SpO2 97% Physical Exam  Constitutional: She is oriented to person, place, and time. She appears well-developed and well-nourished. No distress.  HENT:  Head: Normocephalic and atraumatic.  Right Ear: External ear normal.  Left Ear: External ear normal.  Nose: Nose normal.  Pharynx mildly red without tonsillar enlargement or exudate. Airway intact.  Eyes: Conjunctivae and EOM are normal. Pupils are equal, round, and reactive to light. No scleral icterus.  Neck: Normal range of motion. No JVD present.  Cardiovascular: Normal rate, regular rhythm and normal heart sounds.   Pulmonary/Chest: Effort normal and breath sounds normal. No respiratory distress.  Abdominal:  Gravid  Musculoskeletal: Normal range of motion.  Lymphadenopathy:  She has no cervical adenopathy.  Neurological: She is alert and oriented to person, place, and time.  Skin: Skin is warm. No rash noted.  Psychiatric: She has a normal mood and affect.  Nursing note and vitals reviewed.   ED Course  Procedures (including critical care time) Labs Review Labs Reviewed  POCT RAPID STREP A (OFFICE)   Results for orders placed or performed during the hospital encounter of 09/01/14  POCT rapid strep A  Result Value Ref Range   Rapid Strep A Screen Negative Negative     Imaging Review No results found.   MDM   1. Acute pharyngitis,  unspecified pharyngitis type    Likely viral pharyngitis. Treatment options discussed, as well as risks, benefits, alternatives. Patient voiced understanding and agreement with the following plans:  Sendoff strep test Other symptomatic care discussed.--No medication, even OTC unless approved by her OB. She is [redacted] weeks pregnant. Handout on sore throat given. Follow-up with your primary care doctor in 2-3 days if not improving, or sooner if symptoms become worse. Precautions discussed. Red flags discussed. Questions invited and answered. Patient voiced understanding and agreement.    Lajean Manes, MD 09/01/14 [phone]

## 2014-09-01 NOTE — ED Notes (Signed)
Pt c/o sore throat x 1 day with vomiting and diarrhea intermittently for 1 wk. Denies fever.

## 2014-09-02 LAB — STREP A DNA PROBE: GASP: NEGATIVE

## 2014-09-14 LAB — OB RESULTS CONSOLE GBS: GBS: NEGATIVE

## 2014-10-01 ENCOUNTER — Encounter (HOSPITAL_COMMUNITY): Payer: Self-pay | Admitting: *Deleted

## 2014-10-01 ENCOUNTER — Telehealth (HOSPITAL_COMMUNITY): Payer: Self-pay | Admitting: *Deleted

## 2014-10-01 NOTE — Telephone Encounter (Signed)
Preadmission screen  

## 2014-10-05 ENCOUNTER — Inpatient Hospital Stay (HOSPITAL_COMMUNITY)
Admission: AD | Admit: 2014-10-05 | Payer: BLUE CROSS/BLUE SHIELD | Source: Ambulatory Visit | Admitting: Obstetrics and Gynecology

## 2014-10-05 ENCOUNTER — Inpatient Hospital Stay (HOSPITAL_COMMUNITY)
Admission: RE | Admit: 2014-10-05 | Discharge: 2014-10-09 | DRG: 765 | Disposition: A | Discharge status: Home / Self Care | Payer: BLUE CROSS/BLUE SHIELD | Source: Ambulatory Visit | Attending: Obstetrics and Gynecology | Admitting: Obstetrics and Gynecology

## 2014-10-05 ENCOUNTER — Encounter (HOSPITAL_COMMUNITY): Payer: Self-pay

## 2014-10-05 ENCOUNTER — Other Ambulatory Visit (HOSPITAL_COMMUNITY): Payer: Self-pay | Admitting: Obstetrics and Gynecology

## 2014-10-05 DIAGNOSIS — Z86711 Personal history of pulmonary embolism: Secondary | ICD-10-CM | POA: Diagnosis not present

## 2014-10-05 DIAGNOSIS — O9912 Other diseases of the blood and blood-forming organs and certain disorders involving the immune mechanism complicating childbirth: Principal | ICD-10-CM | POA: Diagnosis present

## 2014-10-05 DIAGNOSIS — Z7901 Long term (current) use of anticoagulants: Secondary | ICD-10-CM | POA: Diagnosis not present

## 2014-10-05 DIAGNOSIS — Z3490 Encounter for supervision of normal pregnancy, unspecified, unspecified trimester: Secondary | ICD-10-CM

## 2014-10-05 DIAGNOSIS — O09293 Supervision of pregnancy with other poor reproductive or obstetric history, third trimester: Secondary | ICD-10-CM | POA: Diagnosis present

## 2014-10-05 DIAGNOSIS — Z3A39 39 weeks gestation of pregnancy: Secondary | ICD-10-CM | POA: Diagnosis present

## 2014-10-05 DIAGNOSIS — O99119 Other diseases of the blood and blood-forming organs and certain disorders involving the immune mechanism complicating pregnancy, unspecified trimester: Secondary | ICD-10-CM

## 2014-10-05 DIAGNOSIS — D6851 Activated protein C resistance: Secondary | ICD-10-CM

## 2014-10-05 DIAGNOSIS — E039 Hypothyroidism, unspecified: Secondary | ICD-10-CM | POA: Diagnosis present

## 2014-10-05 DIAGNOSIS — O99284 Endocrine, nutritional and metabolic diseases complicating childbirth: Secondary | ICD-10-CM | POA: Diagnosis present

## 2014-10-05 LAB — CBC
HCT: 36.6 % (ref 36.0–46.0)
Hemoglobin: 12.9 g/dL (ref 12.0–15.0)
MCH: 30.4 pg (ref 26.0–34.0)
MCHC: 35.2 g/dL (ref 30.0–36.0)
MCV: 86.3 fL (ref 78.0–100.0)
PLATELETS: 193 10*3/uL (ref 150–400)
RBC: 4.24 MIL/uL (ref 3.87–5.11)
RDW: 13.9 % (ref 11.5–15.5)
WBC: 11 10*3/uL — AB (ref 4.0–10.5)

## 2014-10-05 LAB — PREPARE RBC (CROSSMATCH)

## 2014-10-05 MED ORDER — LACTATED RINGERS IV SOLN
500.0000 mL | INTRAVENOUS | Status: DC | PRN
  Administered 2014-10-06: 1000 mL via INTRAVENOUS

## 2014-10-05 MED ORDER — OXYCODONE-ACETAMINOPHEN 5-325 MG PO TABS: 1.0000 | ORAL_TABLET | ORAL | Status: DC | PRN

## 2014-10-05 MED ORDER — LACTATED RINGERS IV SOLN
INTRAVENOUS | Status: DC
  Administered 2014-10-05 – 2014-10-07 (×6): via INTRAVENOUS

## 2014-10-05 MED ORDER — OXYCODONE-ACETAMINOPHEN 5-325 MG PO TABS: 2.0000 | ORAL_TABLET | ORAL | Status: DC | PRN

## 2014-10-05 MED ORDER — CITRIC ACID-SODIUM CITRATE 334-500 MG/5ML PO SOLN
30.0000 mL | ORAL | Status: DC | PRN
  Administered 2014-10-07: 30 mL via ORAL
  Filled 2014-10-05 (×2): qty 15

## 2014-10-05 MED ORDER — ZOLPIDEM TARTRATE 5 MG PO TABS: 5.0000 mg | ORAL_TABLET | Freq: Every evening | ORAL | Status: DC | PRN

## 2014-10-05 MED ORDER — ACETAMINOPHEN 325 MG PO TABS: 650.0000 mg | ORAL_TABLET | ORAL | Status: DC | PRN

## 2014-10-05 MED ORDER — LIDOCAINE HCL (PF) 1 % IJ SOLN: 30.0000 mL | INTRAMUSCULAR | Status: DC | PRN

## 2014-10-05 MED ORDER — SODIUM CHLORIDE 0.9 % IV SOLN: Freq: Once | INTRAVENOUS | Status: DC

## 2014-10-05 MED ORDER — ONDANSETRON HCL 4 MG/2ML IJ SOLN: 4.0000 mg | Freq: Four times a day (QID) | INTRAMUSCULAR | Status: DC | PRN

## 2014-10-05 MED ORDER — MISOPROSTOL 25 MCG QUARTER TABLET
25.0000 ug | ORAL_TABLET | ORAL | Status: DC | PRN
  Administered 2014-10-05 – 2014-10-06 (×2): 25 ug via VAGINAL
  Filled 2014-10-05 (×2): qty 0.25

## 2014-10-05 MED ORDER — OXYTOCIN 40 UNITS IN LACTATED RINGERS INFUSION - SIMPLE MED: 62.5000 mL/h | INTRAVENOUS | Status: DC

## 2014-10-05 MED ORDER — TERBUTALINE SULFATE 1 MG/ML IJ SOLN: 0.2500 mg | Freq: Once | INTRAMUSCULAR | Status: AC | PRN

## 2014-10-05 MED ORDER — OXYTOCIN BOLUS FROM INFUSION: 500.0000 mL | INTRAVENOUS | Status: DC

## 2014-10-05 NOTE — H&P (Signed)
Alicia Frost is a 39 y.o. female  G4 P1020 at 39 wks 0 days ega based on LMP 01/05/2014 with EDD 6/13.2016.presenting for induction of labor due to h/o fetal demise with previous pregnancy at 38wks. Pregnancy is also complicated by Factor V leiden mutation and hypothyroidism. Pt was on lovenox for DVT prophylaxis until 36 wks at which time she was switch to Heparin 5000 units [place] bid. Her last dose of heparin was at 9 am this morning (10/05/2014). + FM no lof no vaginal bleeding. Irregular contractions.    History OB History    Gravida Para Term Preterm AB TAB SAB Ectopic Multiple Living   05/28/1998 IUFD at 38 wks SVD in [place] Miscarriage x 2 U/S on 09/29/2014 EFW was 7 lbs 8 oz  BPP 10/05/2014 ws  8/8  Past Medical History  Diagnosis Date  . Heterozygous factor V Leiden mutation 07/29/2012    Dx at time of acute PE 01/2011  . Pulmonary embolus 07/29/2012    10/12  On OC; found to be V Leiden heterozygote  . Thyroid disease   . Hypothyroidism   . Hx of varicella    Past Surgical History  Procedure Laterality Date  . No past surgeries     Family History: family history includes Diabetes in her maternal grandmother. Social History:  reports that she has quit smoking. She does not have any smokeless tobacco history on file. She reports that she does not drink alcohol or use illicit drugs. Medications. Synthroid 137 mcg daily                       Prenatal vitamins                       Heparin 5000 units [place] BID last dose 9 am 10/05/2014  Allergies NKDA  Prenatal Transfer Tool  Maternal Diabetes: No Genetic Screening: Normal Maternal Ultrasounds/Referrals: Normal Fetal Ultrasounds or other Referrals:  None Maternal Substance Abuse:  No Significant Maternal Medications:  Meds include: Syntroid Other:  heparin  Significant Maternal Lab Results:  Lab values include: Group B Strep negative Other Comments:  None  Review of Systems  Constitutional: Negative.   HENT:  Negative.   Eyes: Negative.   Respiratory: Negative.   Cardiovascular: Negative.   Gastrointestinal: Negative.   Genitourinary: Negative.   Musculoskeletal: Negative.   Skin: Negative.   Neurological: Negative.   Endo/Heme/Allergies: Negative.   Psychiatric/Behavioral: Negative.       Last menstrual period 01/05/2014. Maternal Exam:  Uterine Assessment: Contraction frequency is irregular.   Abdomen: Estimated fetal weight is 7 1/2 to 8 lbs .   Fetal presentation: vertex  Introitus: Normal vulva. Normal vagina.  Pelvis: adequate for delivery.   Cervix: Cervix evaluated by digital exam.     Fetal Exam Fetal Monitor Review: Mode: ultrasound.   Baseline rate: 141.      Physical Exam  Constitutional: She is oriented to person, place, and time. She appears well-developed. She appears distressed.  HENT:  Head: Normocephalic and atraumatic.  Neck: Normal range of motion.  Cardiovascular: Normal rate and regular rhythm.   Respiratory: Effort normal and breath sounds normal.  GI: There is no tenderness.  Genitourinary: Vagina normal and uterus normal.  Cervix FT/50/-2  Musculoskeletal: Normal range of motion. She exhibits edema.  Neurological: She is alert and oriented to person, place, and time. She has  normal reflexes.  Skin: Skin is warm and dry.  Psychiatric: She has a normal mood and affect.    Prenatal labs: ABO, Rh: A/Positive/-- (10/26 0000) Antibody: Negative (10/26 0000) Rubella: Immune (10/26 0000) RPR: Nonreactive (10/26 0000)  HBsAg: Negative (10/26 0000)  HIV: Non-reactive (10/26 0000)  GBS: Negative (05/16 0000)   Assessment/Plan: 39 wks and 0 days with h/o IUFD at 38 wks with previous pregnancy in 2000 / Facter V leiden mutation ./ H/O pulmonary embolism 2012  Induction of labor due to above. Increased r/o cesarean section with induction discussed and patient accepts this risk.  Continuous Fetal monitoring.  Cytotec for induction  SCD's  Pt desires  an epidural for pain controlled. She is encouraged not to get this until she is at least 3 cm dilated.   Plan restarting prophylactic lovenox 12-24 hours post delivery until 6 wks postpartum  Dr. Dion BodyVarnado covering 10/05/2014 7 pm to 7 am 10/06/2014  Vicki Chaffin J. 10/05/2014, 5:29 PM

## 2014-10-06 ENCOUNTER — Inpatient Hospital Stay (HOSPITAL_COMMUNITY): Payer: BLUE CROSS/BLUE SHIELD | Admitting: Anesthesiology

## 2014-10-06 ENCOUNTER — Encounter (HOSPITAL_COMMUNITY): Payer: Self-pay

## 2014-10-06 LAB — ABO/RH: ABO/RH(D): A POS

## 2014-10-06 LAB — COMPREHENSIVE METABOLIC PANEL
ALK PHOS: 122 U/L (ref 38–126)
ALT: 10 U/L — AB (ref 14–54)
AST: 15 U/L (ref 15–41)
Albumin: 2.9 g/dL — ABNORMAL LOW (ref 3.5–5.0)
Anion gap: 5 (ref 5–15)
BUN: 7 mg/dL (ref 6–20)
CHLORIDE: 107 mmol/L (ref 101–111)
CO2: 22 mmol/L (ref 22–32)
Calcium: 8.4 mg/dL — ABNORMAL LOW (ref 8.9–10.3)
Creatinine, Ser: 0.59 mg/dL (ref 0.44–1.00)
GFR calc Af Amer: >60 mL/min (ref >60)
GLUCOSE: 80 mg/dL (ref 65–99)
Potassium: 3.8 mmol/L (ref 3.5–5.1)
Sodium: 134 mmol/L — ABNORMAL LOW (ref 135–145)
Total Bilirubin: 0.6 mg/dL (ref 0.3–1.2)
Total Protein: 6.3 g/dL — ABNORMAL LOW (ref 6.5–8.1)

## 2014-10-06 LAB — CBC
HCT: 35.5 % — ABNORMAL LOW (ref 36.0–46.0)
Hemoglobin: 12.3 g/dL (ref 12.0–15.0)
MCH: 29.6 pg (ref 26.0–34.0)
MCHC: 34.6 g/dL (ref 30.0–36.0)
MCV: 85.3 fL (ref 78.0–100.0)
Platelets: 165 10*3/uL (ref 150–400)
RBC: 4.16 MIL/uL (ref 3.87–5.11)
RDW: 13.6 % (ref 11.5–15.5)
WBC: 11.2 10*3/uL — AB (ref 4.0–10.5)

## 2014-10-06 LAB — LACTATE DEHYDROGENASE: LDH: 138 U/L (ref 98–192)

## 2014-10-06 LAB — RPR: RPR: NONREACTIVE

## 2014-10-06 LAB — PROTEIN / CREATININE RATIO, URINE
CREATININE, URINE: 211 mg/dL
PROTEIN CREATININE RATIO: 0.06 mg/mg{creat} (ref 0.00–0.15)
Total Protein, Urine: 12 mg/dL

## 2014-10-06 LAB — URIC ACID: Uric Acid, Serum: 4.7 mg/dL (ref 2.3–6.6)

## 2014-10-06 MED ORDER — PHENYLEPHRINE 40 MCG/ML (10ML) SYRINGE FOR IV PUSH (FOR BLOOD PRESSURE SUPPORT): 80.0000 ug | PREFILLED_SYRINGE | INTRAVENOUS | Status: DC | PRN

## 2014-10-06 MED ORDER — FENTANYL 2.5 MCG/ML BUPIVACAINE 1/10 % EPIDURAL INFUSION (WH - ANES)
14.0000 mL/h | INTRAMUSCULAR | Status: DC | PRN
  Administered 2014-10-06 – 2014-10-07 (×2): 14 mL/h via EPIDURAL
  Filled 2014-10-06 (×2): qty 125

## 2014-10-06 MED ORDER — FENTANYL 2.5 MCG/ML BUPIVACAINE 1/10 % EPIDURAL INFUSION (WH - ANES): 14.0000 mL/h | INTRAMUSCULAR | Status: DC | PRN

## 2014-10-06 MED ORDER — EPHEDRINE 5 MG/ML INJ: 10.0000 mg | INTRAVENOUS | Status: DC | PRN

## 2014-10-06 MED ORDER — TERBUTALINE SULFATE 1 MG/ML IJ SOLN: 0.2500 mg | Freq: Once | INTRAMUSCULAR | Status: AC | PRN

## 2014-10-06 MED ORDER — DIPHENHYDRAMINE HCL 50 MG/ML IJ SOLN: 12.5000 mg | INTRAMUSCULAR | Status: DC | PRN

## 2014-10-06 MED ORDER — PHENYLEPHRINE 40 MCG/ML (10ML) SYRINGE FOR IV PUSH (FOR BLOOD PRESSURE SUPPORT)
80.0000 ug | PREFILLED_SYRINGE | INTRAVENOUS | Status: DC | PRN
  Administered 2014-10-06: 80 ug via INTRAVENOUS
  Filled 2014-10-06: qty 20

## 2014-10-06 MED ORDER — ASPIRIN 81 MG PO CHEW
81.0000 mg | CHEWABLE_TABLET | Freq: Every day | ORAL | Status: DC
  Administered 2014-10-06 – 2014-10-08 (×3): 81 mg via ORAL
  Filled 2014-10-06 (×4): qty 1

## 2014-10-06 MED ORDER — LIDOCAINE HCL (PF) 1 % IJ SOLN
INTRAMUSCULAR | Status: DC | PRN
  Administered 2014-10-06: 4 mL
  Administered 2014-10-06: 5 mL
  Administered 2014-10-06: 4 mL

## 2014-10-06 MED ORDER — OXYTOCIN 40 UNITS IN LACTATED RINGERS INFUSION - SIMPLE MED
1.0000 m[IU]/min | INTRAVENOUS | Status: DC
  Administered 2014-10-06: 2 m[IU]/min via INTRAVENOUS
  Filled 2014-10-06: qty 1000

## 2014-10-06 NOTE — Anesthesia Preprocedure Evaluation (Signed)
Anesthesia Evaluation  Patient identified by MRN, date of birth, ID band Patient awake    Reviewed: Allergy & Precautions, Patient's Chart, lab work & pertinent test results  Airway Mallampati: III  TM Distance: >3 FB Neck ROM: Full    Dental no notable dental hx. (+) Teeth Intact   Pulmonary former smoker,  breath sounds clear to auscultation  Pulmonary exam normal       Cardiovascular Normal cardiovascular examRhythm:Regular Rate:Normal  Hx/o PTE 2014 found to be heterozygous for Factor V Leiden mutation. On Heparin- last dose greater than 24 hours ago   Neuro/Psych negative neurological ROS  negative psych ROS   GI/Hepatic Neg liver ROS, GERD-  Medicated and Controlled,  Endo/Other  Hypothyroidism Morbid obesity  Renal/GU negative Renal ROS  negative genitourinary   Musculoskeletal   Abdominal (+) + obese,   Peds  Hematology Factor V leiden mutation- heterozygous   Anesthesia Other Findings   Reproductive/Obstetrics                             Anesthesia Physical Anesthesia Plan  ASA: III  Anesthesia Plan: Epidural   Post-op Pain Management:    Induction:   Airway Management Planned: Natural Airway  Additional Equipment:   Intra-op Plan:   Post-operative Plan:   Informed Consent: I have reviewed the patients History and Physical, chart, labs and discussed the procedure including the risks, benefits and alternatives for the proposed anesthesia with the patient or authorized representative who has indicated his/her understanding and acceptance.     Plan Discussed with: Anesthesiologist  Anesthesia Plan Comments:         Anesthesia Quick Evaluation

## 2014-10-06 NOTE — Progress Notes (Signed)
Rhetta Muralicia Spiering is a 39 y.o. G6P1040 at 5516w1d by LMP admitted for induction of labor due to h/o IUFD at 6138 wks with previous pregnancy and Factor V leiden mutation. .  Subjective: In to assess patient secondary to variable decelerations. Pt comfortable with her epidural.   Objective: BP 129/82 mmHg  Pulse 87  Temp(Src) 97.9 F (36.6 C) (Oral)  Resp 20  Ht 5\' 8"  (1.727 m)  Wt 260 lb (117.935 kg)  BMI 39.54 kg/m2  SpO2 100%  LMP 01/05/2014   Total I/O In: -  Out: 150 [Urine:150]  FHT:  FHR: 140 bpm, variability: moderate,  accelerations:  Abscent,  decelerations:  Present late and variable decelerations   UC:   regular, every 2-3  Minutes IUPC and FSE placed. Moderate amount of bloody show with small clot noted  SVE:   Dilation: 6.5 Effacement (%): 60, 70 Station: -2 Exam by:: Dr. Richardson Doppole   Labs: Lab Results  Component Value Date   WBC 11.2* 10/06/2014   HGB 12.3 10/06/2014   HCT 35.5* 10/06/2014   MCV 85.3 10/06/2014   PLT 165 10/06/2014    Assessment / Plan: 39 wks 1 day with h/o IUFD at 38 wks with previous pregnancy   Labor: progressing however will discontinue pitocin due to late and deep variable decelerations.  Start Amnioinfusion . d/w pt need for Cesarean section if FHR continues to be nonreassuring with interventions Preeclampsia:  NA Fetal Wellbeing:  Category II Pain Control:  Epidural I/D:  n/a Anticipated MOD:  if fhr continues to be category II recommend cesarean section will reeevaluate in 15 minutes. d/w pt r/b/a of cesarean section including but not limited to infection bleeding damage to bowel bladder and baby with the need for further surgery. r/o Transfusion HIV/ Hep B&C discussed. pt voiced understanding.   Zaria Taha J. 10/06/2014, 6:37 PM

## 2014-10-06 NOTE — Anesthesia Procedure Notes (Signed)
Epidural Patient location during procedure: OB Start time: 10/06/2014 4:15 PM  Staffing Anesthesiologist: Mal AmabileFOSTER, Kambra Beachem Performed by: anesthesiologist   Preanesthetic Checklist Completed: patient identified, site marked, surgical consent, pre-op evaluation, timeout performed, IV checked, risks and benefits discussed and monitors and equipment checked  Epidural Patient position: sitting Prep: site prepped and draped and DuraPrep Patient monitoring: continuous pulse ox and blood pressure Approach: midline Location: L3-L4 Injection technique: LOR air  Needle:  Needle type: Tuohy  Needle gauge: 17 G Needle length: 9 cm and 9 Needle insertion depth: 5 cm cm Catheter type: closed end flexible Catheter size: 19 Gauge Catheter at skin depth: 10 cm Test dose: negative and Other  Assessment Events: blood not aspirated, injection not painful, no injection resistance, negative IV test and paresthesia  Additional Notes Patient identified. Risks and benefits discussed including failed block, incomplete  Pain control, post dural puncture headache, nerve damage, paralysis, blood pressure Changes, nausea, vomiting, reactions to medications-both toxic and allergic and post Partum back pain. All questions were answered. Patient expressed understanding and wished to proceed. Sterile technique was used throughout procedure. Epidural site was Dressed with sterile barrier dressing. No paresthesias, signs of intravascular injection Or signs of intrathecal spread were encountered. Attempt x 2. Paresthesia right leg upon threading catheter. Needle redirected. Patient was more comfortable after the epidural was dosed. Please see RN's note for documentation of vital signs and FHR which are stable.

## 2014-10-06 NOTE — Progress Notes (Signed)
  Subjective: Assumed care and in to make the acquaintance of this 39 yo G4P1020 @ 39.1 wks admitted for IOL due to hx of IUFD at 38 wks in previous pregnancy. Pt tearful, states just ready for this to be over. Requests regular ASA dose due to Factor V Leiden mutation.  Objective: BP 137/86 mmHg  Pulse 99  Temp(Src) 98.6 F (37 C) (Oral)  Resp 18  Ht 5\' 8"  (1.727 m)  Wt 117.935 kg (260 lb)  BMI 39.54 kg/m2  SpO2 100%  LMP 01/05/2014 I/O last 3 completed shifts: In: -  Out: 425 [Urine:425]    Today's Vitals   10/06/14 2030 10/06/14 2100 10/06/14 2130 10/06/14 2204  BP: 116/59 118/60 123/63 137/86  Pulse: 95 97 96 99  Temp:    98.6 F (37 C)  TempSrc:    Oral  Resp:      Height:      Weight:      SpO2:      PainSc:        FHT: Category 1 at present UC:   irregular SVE:   Dilation: 6.5 Effacement (%): 70 Station: -2 Exam by:: Genelle BalE. Johnson, RN   Assessment:  IUP at 39.1 wks IOL due to h/o IUFD at term in previous pregnancy H/O Factor V Leiden Hypothyroidism  Plan: Discussed pt's desire for ASA w/ Dr. Sallye OberKulwa and Anesthesia provider -- pt may continue med -- ordered.  Restart Pitocin. Monitor closely. Consult prn.  Sherre ScarletWILLIAMS, Deniro Laymon CNM 10/06/2014, 10:21 PM

## 2014-10-06 NOTE — Progress Notes (Signed)
Alicia Frost is a 39 y.o. G6P1040 at 7569w1d by LMP admitted for induction of labor due to h/o IUFD at 3338wks with previous pregnancy / .  Subjective:  Patient rates contractions as a 4 out of 10. No lof no vaginal bleeding.    Objective: BP 144/79 mmHg  Pulse 80  Temp(Src) 98 F (36.7 C) (Oral)  Resp 18  Ht 5\' 8"  (1.727 m)  Wt 260 lb (117.935 kg)  BMI 39.54 kg/m2  LMP 01/05/2014      FHT:  FHR: 140 bpm, variability: moderate,  accelerations:  Present,  decelerations:  Absent UC:   regular, every 2-3 minutes SVE:  1.5/50/-2 Labs: Lab Results  Component Value Date   WBC 11.0* 10/05/2014   HGB 12.9 10/05/2014   HCT 36.6 10/05/2014   MCV 86.3 10/05/2014   PLT 193 10/05/2014    Assessment / Plan: 39 wks and 1 day  Induction due to h/o IUFD at 38 wks with previous pregnancy  Labor: start pitocin  Preeclampsia:  bp mildly elevated will check PIH labs  Fetal Wellbeing:  Category I Pain Control:  desires epidural later I/D:  n/a Anticipated MOD:  NSVD  Tiandre Teall J. 10/06/2014, 8:22 AM

## 2014-10-07 ENCOUNTER — Encounter (HOSPITAL_COMMUNITY)
Admission: RE | Disposition: A | Discharge status: Home / Self Care | Payer: Self-pay | Source: Ambulatory Visit | Attending: Obstetrics and Gynecology

## 2014-10-07 ENCOUNTER — Encounter (HOSPITAL_COMMUNITY): Payer: Self-pay

## 2014-10-07 DIAGNOSIS — O99119 Other diseases of the blood and blood-forming organs and certain disorders involving the immune mechanism complicating pregnancy, unspecified trimester: Secondary | ICD-10-CM

## 2014-10-07 DIAGNOSIS — D6851 Activated protein C resistance: Secondary | ICD-10-CM

## 2014-10-07 LAB — CBC
HCT: 30.6 % — ABNORMAL LOW (ref 36.0–46.0)
HEMOGLOBIN: 10.6 g/dL — AB (ref 12.0–15.0)
MCH: 29.8 pg (ref 26.0–34.0)
MCHC: 34.6 g/dL (ref 30.0–36.0)
MCV: 86 fL (ref 78.0–100.0)
Platelets: 176 10*3/uL (ref 150–400)
RBC: 3.56 MIL/uL — ABNORMAL LOW (ref 3.87–5.11)
RDW: 13.6 % (ref 11.5–15.5)
WBC: 17.5 10*3/uL — ABNORMAL HIGH (ref 4.0–10.5)

## 2014-10-07 LAB — CREATININE, SERUM
CREATININE: 0.83 mg/dL (ref 0.44–1.00)
GFR calc Af Amer: >60 mL/min (ref >60)

## 2014-10-07 SURGERY — Surgical Case
Anesthesia: Epidural

## 2014-10-07 MED ORDER — SENNOSIDES-DOCUSATE SODIUM 8.6-50 MG PO TABS
2.0000 | ORAL_TABLET | ORAL | Status: DC
  Administered 2014-10-07 – 2014-10-08 (×2): 2 via ORAL
  Filled 2014-10-07 (×2): qty 2

## 2014-10-07 MED ORDER — FENTANYL CITRATE (PF) 100 MCG/2ML IJ SOLN
INTRAMUSCULAR | Status: AC
  Filled 2014-10-07: qty 2

## 2014-10-07 MED ORDER — NALBUPHINE HCL 10 MG/ML IJ SOLN: 5.0000 mg | Freq: Once | INTRAMUSCULAR | Status: AC | PRN

## 2014-10-07 MED ORDER — MORPHINE SULFATE (PF) 0.5 MG/ML IJ SOLN
INTRAMUSCULAR | Status: DC | PRN
  Administered 2014-10-07: 1 mg via INTRAVENOUS
  Administered 2014-10-07: 4 mg via EPIDURAL

## 2014-10-07 MED ORDER — LANOLIN HYDROUS EX OINT: 1.0000 "application " | TOPICAL_OINTMENT | CUTANEOUS | Status: DC | PRN

## 2014-10-07 MED ORDER — MORPHINE SULFATE 0.5 MG/ML IJ SOLN
INTRAMUSCULAR | Status: AC
  Filled 2014-10-07: qty 10

## 2014-10-07 MED ORDER — SCOPOLAMINE 1 MG/3DAYS TD PT72
1.0000 | MEDICATED_PATCH | Freq: Once | TRANSDERMAL | Status: DC
  Filled 2014-10-07: qty 1

## 2014-10-07 MED ORDER — MENTHOL 3 MG MT LOZG: 1.0000 | LOZENGE | OROMUCOSAL | Status: DC | PRN

## 2014-10-07 MED ORDER — ENOXAPARIN SODIUM 40 MG/0.4ML ~~LOC~~ SOLN
40.0000 mg | SUBCUTANEOUS | Status: DC
  Administered 2014-10-07: 40 mg via SUBCUTANEOUS
  Filled 2014-10-07: qty 0.4

## 2014-10-07 MED ORDER — MEPERIDINE HCL 25 MG/ML IJ SOLN: 6.2500 mg | INTRAMUSCULAR | Status: DC | PRN

## 2014-10-07 MED ORDER — OXYTOCIN 40 UNITS IN LACTATED RINGERS INFUSION - SIMPLE MED: 62.5000 mL/h | INTRAVENOUS | Status: AC

## 2014-10-07 MED ORDER — FENTANYL CITRATE (PF) 100 MCG/2ML IJ SOLN
25.0000 ug | INTRAMUSCULAR | Status: DC | PRN
  Administered 2014-10-07: 50 ug via INTRAVENOUS

## 2014-10-07 MED ORDER — SIMETHICONE 80 MG PO CHEW
80.0000 mg | CHEWABLE_TABLET | ORAL | Status: DC | PRN
  Filled 2014-10-07: qty 1

## 2014-10-07 MED ORDER — ONDANSETRON HCL 4 MG/2ML IJ SOLN: 4.0000 mg | Freq: Three times a day (TID) | INTRAMUSCULAR | Status: DC | PRN

## 2014-10-07 MED ORDER — NALOXONE HCL 0.4 MG/ML IJ SOLN: 0.4000 mg | INTRAMUSCULAR | Status: DC | PRN

## 2014-10-07 MED ORDER — NALBUPHINE HCL 10 MG/ML IJ SOLN: 5.0000 mg | INTRAMUSCULAR | Status: DC | PRN

## 2014-10-07 MED ORDER — OXYCODONE-ACETAMINOPHEN 5-325 MG PO TABS
1.0000 | ORAL_TABLET | ORAL | Status: DC | PRN
  Administered 2014-10-08 – 2014-10-09 (×3): 1 via ORAL
  Filled 2014-10-07 (×3): qty 1

## 2014-10-07 MED ORDER — TETANUS-DIPHTH-ACELL PERTUSSIS 5-2.5-18.5 LF-MCG/0.5 IM SUSP: 0.5000 mL | Freq: Once | INTRAMUSCULAR | Status: DC

## 2014-10-07 MED ORDER — OXYTOCIN 10 UNIT/ML IJ SOLN
40.0000 [IU] | INTRAVENOUS | Status: DC | PRN
  Administered 2014-10-07: 40 [IU] via INTRAVENOUS

## 2014-10-07 MED ORDER — LACTATED RINGERS IV SOLN
INTRAVENOUS | Status: DC
  Administered 2014-10-07: 12:00:00 via INTRAVENOUS

## 2014-10-07 MED ORDER — ONDANSETRON HCL 4 MG/2ML IJ SOLN
INTRAMUSCULAR | Status: AC
  Filled 2014-10-07: qty 2

## 2014-10-07 MED ORDER — ENOXAPARIN SODIUM 40 MG/0.4ML ~~LOC~~ SOLN
40.0000 mg | SUBCUTANEOUS | Status: DC
  Filled 2014-10-07: qty 0.4

## 2014-10-07 MED ORDER — DIPHENHYDRAMINE HCL 25 MG PO CAPS: 25.0000 mg | ORAL_CAPSULE | Freq: Four times a day (QID) | ORAL | Status: DC | PRN

## 2014-10-07 MED ORDER — 0.9 % SODIUM CHLORIDE (POUR BTL) OPTIME: TOPICAL | Status: DC | PRN

## 2014-10-07 MED ORDER — DIBUCAINE 1 % RE OINT: 1.0000 "application " | TOPICAL_OINTMENT | RECTAL | Status: DC | PRN

## 2014-10-07 MED ORDER — SIMETHICONE 80 MG PO CHEW
80.0000 mg | CHEWABLE_TABLET | ORAL | Status: DC
  Administered 2014-10-07 – 2014-10-08 (×2): 80 mg via ORAL
  Filled 2014-10-07: qty 1

## 2014-10-07 MED ORDER — SODIUM BICARBONATE 8.4 % IV SOLN
INTRAVENOUS | Status: DC | PRN
  Administered 2014-10-07 (×3): 5 mL via EPIDURAL

## 2014-10-07 MED ORDER — OXYCODONE-ACETAMINOPHEN 5-325 MG PO TABS: 2.0000 | ORAL_TABLET | ORAL | Status: DC | PRN

## 2014-10-07 MED ORDER — NALOXONE HCL 1 MG/ML IJ SOLN
1.0000 ug/kg/h | INTRAVENOUS | Status: DC | PRN
  Filled 2014-10-07: qty 2

## 2014-10-07 MED ORDER — OXYTOCIN 10 UNIT/ML IJ SOLN
INTRAMUSCULAR | Status: AC
  Filled 2014-10-07: qty 4

## 2014-10-07 MED ORDER — FENTANYL CITRATE (PF) 100 MCG/2ML IJ SOLN
INTRAMUSCULAR | Status: DC | PRN
  Administered 2014-10-07: 100 ug via INTRAVENOUS

## 2014-10-07 MED ORDER — ACETAMINOPHEN 500 MG PO TABS
1000.0000 mg | ORAL_TABLET | Freq: Four times a day (QID) | ORAL | Status: AC
  Administered 2014-10-07 (×3): 1000 mg via ORAL
  Filled 2014-10-07 (×2): qty 2

## 2014-10-07 MED ORDER — WITCH HAZEL-GLYCERIN EX PADS: 1.0000 "application " | MEDICATED_PAD | CUTANEOUS | Status: DC | PRN

## 2014-10-07 MED ORDER — PROMETHAZINE HCL 25 MG/ML IJ SOLN
6.2500 mg | INTRAMUSCULAR | Status: DC | PRN
Start: 2014-10-07 — End: 2014-10-07

## 2014-10-07 MED ORDER — VITAMIN K1 1 MG/0.5ML IJ SOLN
INTRAMUSCULAR | Status: AC
  Filled 2014-10-07: qty 0.5

## 2014-10-07 MED ORDER — IBUPROFEN 600 MG PO TABS
600.0000 mg | ORAL_TABLET | Freq: Four times a day (QID) | ORAL | Status: DC
  Administered 2014-10-07 – 2014-10-09 (×9): 600 mg via ORAL
  Filled 2014-10-07 (×9): qty 1

## 2014-10-07 MED ORDER — ZOLPIDEM TARTRATE 5 MG PO TABS: 5.0000 mg | ORAL_TABLET | Freq: Every evening | ORAL | Status: DC | PRN

## 2014-10-07 MED ORDER — DIPHENHYDRAMINE HCL 50 MG/ML IJ SOLN: 12.5000 mg | INTRAMUSCULAR | Status: DC | PRN

## 2014-10-07 MED ORDER — 0.9 % SODIUM CHLORIDE (POUR BTL) OPTIME
TOPICAL | Status: DC | PRN
  Administered 2014-10-07 (×2): 1000 mL

## 2014-10-07 MED ORDER — PRENATAL MULTIVITAMIN CH
1.0000 | ORAL_TABLET | Freq: Every day | ORAL | Status: DC
  Administered 2014-10-07 – 2014-10-08 (×2): 1 via ORAL
  Filled 2014-10-07 (×2): qty 1

## 2014-10-07 MED ORDER — SIMETHICONE 80 MG PO CHEW
80.0000 mg | CHEWABLE_TABLET | Freq: Three times a day (TID) | ORAL | Status: DC
  Administered 2014-10-07 – 2014-10-08 (×6): 80 mg via ORAL
  Filled 2014-10-07 (×5): qty 1

## 2014-10-07 MED ORDER — LACTATED RINGERS IV SOLN
INTRAVENOUS | Status: DC | PRN
  Administered 2014-10-07: 02:00:00 via INTRAVENOUS

## 2014-10-07 MED ORDER — SODIUM BICARBONATE 8.4 % IV SOLN
INTRAVENOUS | Status: AC
Start: 2014-10-07 — End: 2014-10-07
  Filled 2014-10-07: qty 50

## 2014-10-07 MED ORDER — ONDANSETRON HCL 4 MG/2ML IJ SOLN
INTRAMUSCULAR | Status: DC | PRN
  Administered 2014-10-07: 4 mg via INTRAVENOUS

## 2014-10-07 MED ORDER — CEFAZOLIN SODIUM-DEXTROSE 2-3 GM-% IV SOLR
INTRAVENOUS | Status: DC | PRN
  Administered 2014-10-07: 2 g via INTRAVENOUS

## 2014-10-07 MED ORDER — ACETAMINOPHEN 325 MG PO TABS: 650.0000 mg | ORAL_TABLET | ORAL | Status: DC | PRN

## 2014-10-07 MED ORDER — DIPHENHYDRAMINE HCL 25 MG PO CAPS: 25.0000 mg | ORAL_CAPSULE | ORAL | Status: DC | PRN

## 2014-10-07 MED ORDER — SCOPOLAMINE 1 MG/3DAYS TD PT72
MEDICATED_PATCH | TRANSDERMAL | Status: DC | PRN
  Administered 2014-10-07: 1 via TRANSDERMAL

## 2014-10-07 MED ORDER — LIDOCAINE-EPINEPHRINE (PF) 2 %-1:200000 IJ SOLN
INTRAMUSCULAR | Status: AC
  Filled 2014-10-07: qty 20

## 2014-10-07 MED ORDER — SODIUM CHLORIDE 0.9 % IJ SOLN: 3.0000 mL | INTRAMUSCULAR | Status: DC | PRN

## 2014-10-07 SURGICAL SUPPLY — 37 items
CLAMP CORD UMBIL (MISCELLANEOUS) IMPLANT
CLOTH BEACON ORANGE TIMEOUT ST (SAFETY) ×3 IMPLANT
COVER LIGHT HANDLE  1/PK (MISCELLANEOUS) ×2
COVER LIGHT HANDLE 1/PK (MISCELLANEOUS) ×1 IMPLANT
DRAPE SHEET LG 3/4 BI-LAMINATE (DRAPES) IMPLANT
DRSG OPSITE POSTOP 4X10 (GAUZE/BANDAGES/DRESSINGS) ×3 IMPLANT
DURAPREP 26ML APPLICATOR (WOUND CARE) ×3 IMPLANT
ELECT REM PT RETURN 9FT ADLT (ELECTROSURGICAL) ×3
ELECTRODE REM PT RTRN 9FT ADLT (ELECTROSURGICAL) ×1 IMPLANT
EXTRACTOR VACUUM M CUP 4 TUBE (SUCTIONS) IMPLANT
EXTRACTOR VACUUM M CUP 4' TUBE (SUCTIONS)
GLOVE BIOGEL PI IND STRL 7.0 (GLOVE) ×1 IMPLANT
GLOVE BIOGEL PI INDICATOR 7.0 (GLOVE) ×2
GLOVE ECLIPSE 7.0 STRL STRAW (GLOVE) ×3 IMPLANT
GLOVE SURG SS PI 6.5 STRL IVOR (GLOVE) ×3 IMPLANT
GLOVE SURG SS PI 7.0 STRL IVOR (GLOVE) ×3 IMPLANT
GOWN STRL REUS W/TWL LRG LVL3 (GOWN DISPOSABLE) ×6 IMPLANT
KIT ABG SYR 3ML LUER SLIP (SYRINGE) IMPLANT
LIQUID BAND (GAUZE/BANDAGES/DRESSINGS) ×3 IMPLANT
NEEDLE HYPO 25X5/8 SAFETYGLIDE (NEEDLE) IMPLANT
NS IRRIG 1000ML POUR BTL (IV SOLUTION) ×3 IMPLANT
PACK C SECTION WH (CUSTOM PROCEDURE TRAY) ×3 IMPLANT
PAD OB MATERNITY 4.3X12.25 (PERSONAL CARE ITEMS) ×3 IMPLANT
RTRCTR C-SECT PINK 25CM LRG (MISCELLANEOUS) ×3 IMPLANT
SUT CHROMIC 1 CTX 36 (SUTURE) IMPLANT
SUT CHROMIC 2 0 CT 1 (SUTURE) ×6 IMPLANT
SUT MON AB 4-0 PS1 27 (SUTURE) ×3 IMPLANT
SUT PLAIN 1 NONE 54 (SUTURE) IMPLANT
SUT PLAIN 2 0 (SUTURE) ×2
SUT PLAIN 2 0 XLH (SUTURE) ×3 IMPLANT
SUT PLAIN ABS 2-0 CT1 27XMFL (SUTURE) ×1 IMPLANT
SUT VIC AB 0 CTX 36 (SUTURE) ×4
SUT VIC AB 0 CTX36XBRD ANBCTRL (SUTURE) ×2 IMPLANT
SUT VIC AB 1 CTX 36 (SUTURE) ×4
SUT VIC AB 1 CTX36XBRD ANBCTRL (SUTURE) ×2 IMPLANT
TOWEL OR 17X24 6PK STRL BLUE (TOWEL DISPOSABLE) ×3 IMPLANT
TRAY FOLEY CATH SILVER 14FR (SET/KITS/TRAYS/PACK) ×3 IMPLANT

## 2014-10-07 NOTE — Progress Notes (Addendum)
Strip reviewed. Repetitive lates noted despite intrauterine measures and amnioinfusion. Moderate variability overall --- reached max 2 mU/min Pitocin. Pitocin stopped -- 02 and IVF bolus administered. Informed of NRFHRT -- c-section recommended. Pt concurred. Risks, Benefits, Alternatives including but not limited to bleeding, infection and injury were discussed with the patient. Patient verbalized understanding and consent signed and witnessed.  Dr. Sallye OberKulwa updated and enroute to hospital for c-section.   Ancef 2 g on call to OR.   Sherre ScarletKimberly Timouthy Gilardi, CNM 10/07/14, 12:50 AM

## 2014-10-07 NOTE — Anesthesia Postprocedure Evaluation (Incomplete)
  Anesthesia Post-op Note  Patient: Alicia Frost  Procedure(s) Performed: Procedure(s): CESAREAN SECTION (N/A)  Patient Location: Mother/Baby  Anesthesia Type:Epidural  Level of Consciousness: awake, alert , oriented and patient cooperative  Airway and Oxygen Therapy: Patient Spontanous Breathing and Patient connected to nasal cannula oxygen  Post-op Pain: none  Post-op Assessment: Post-op Vital signs reviewed, Patient's Cardiovascular Status Stable, Respiratory Function Stable, Patent Airway, No signs of Nausea or vomiting, Adequate PO intake, Pain level controlled, No headache and No backache              Post-op Vital Signs: Reviewed and stable  Last Vitals:  Filed Vitals:   10/07/14 0615  BP: 114/56  Pulse: 66  Temp: 37.4 C  Resp: 18    Complications: No apparent anesthesia complications

## 2014-10-07 NOTE — Brief Op Note (Signed)
10/05/2014 - 10/07/2014  3:49 AM  PATIENT:  Alicia MuraAlicia Baugher  39 y.o. female  PRE-OPERATIVE DIAGNOSIS:  Primary cesarean section for fetal intolerance of labor  POST-OPERATIVE DIAGNOSIS:  Primary cesarean section for fetal intolerance of labor  PROCEDURE:  Procedure(s): CESAREAN SECTION (N/A)  SURGEON:  Surgeon(s) and Role:    * Hoover BrownsEma Jacquline Terrill, MD - Primary  PHYSICIAN ASSISTANT:   ASSISTANTS: Sherre ScarletKIMBERLY WILLIAMS, CNM   ANESTHESIA:   spinal  EBL:  Total I/O In: 2500 [I.V.:2500] Out: 1150 [Urine:350; Blood:800]  BLOOD ADMINISTERED:none  DRAINS: none   LOCAL MEDICATIONS USED:  NONE  SPECIMEN:  PLACENTA, CORD BLOOD  DISPOSITION OF SPECIMEN:  PATHOLOGY  COUNTS:  YES  TOURNIQUET:  * No tourniquets in log *  DICTATION: .Dragon Dictation  PLAN OF CARE: Admit to inpatient   PATIENT DISPOSITION:  PACU - hemodynamically stable.   Delay start of Pharmacological VTE agent (>24hrs) due to surgical blood loss or risk of bleeding: no

## 2014-10-07 NOTE — Op Note (Signed)
Frost, Alicia. MRN [id number]  DATE OF SURGERY: 04/08/2014   PREOP DIAGNOSIS:  1. 39 week 1 day  EGA intrauterine pregnancy 2. Induction of labor secondary to history of IUFD at 38 weeks. 3.  History of a pulmonary embolus in 2012 and with factor V Leiden mutation, was on Lovenox, then heparin in pregnancy. 4.  Fetal intolerance of labor induction, recurrent late decelerations while on Pitocin.    POSTOP DIAGNOSIS: Same as above.  PROCEDURE: Primary low uterine segment transverse cesarean section via Pfannenstiel incision.     SURGEON: Dr.  Hoover BrownsEMA Linkoln Alkire  ASSISTANT: Sherre ScarletKimberly Williams, CNM  ANESTHESIA: Epidural  COMPLICATIONS: None  FINDINGS: Viable female infant in cephalic presentation, DOA, Apgar scores of 8 and 9. Normal uterus.      EBL: 800 cc  IV FLUID: 2500 cc LR   URINE OUTPUT: 350 cc clear urine  INDICATIONS: 38 y/og4p1020 who presented for labor induction secondary to history of a term IUFD. She received Cytotec and then Pitocin and was noted to have recurrent variable decelerations, then recurrent late decelerations while on Pitocin.  The abnormal tracings did not resolve despite intrauterine resuscitation A primary cesarean section was done for fetal intolerance of labor induction. Her cervix was unchanged at 6-7 cm.  She was consented for the procedure after explaining risks benefits and alternatives of the procedure.    PROCEDURE:   Informed consent was obtained from the patient to undergo the procedure. She was taken to the operating room where her spinal anesthesia was found to be adequate. She was prepped and draped in the usual sterile fashion and a Foley catheter was placed. She received 2 g of IV Ancef preoperatively. A Pfannenstiel incision was made with the scalpel and the incision extended through the subcutaneous layer and also the fascia with the bovie. Small perforators in the subcutaneous layer were contained with the Bovie. The fascia was nicked in the midline  and then was further separated from the rectus muscles bilaterally using Mayo scissors. Kochers were placed inferiorly and then superiorly to allow further separation of fascia from the rectus muscles.  The peritoneal cavity was entered bluntly with the fingers. The Alexis retractor was placed in. The bladder flap was created using Metzenbaum scissors.   The uterus was incised with a scalpel and the incision extended bluntly bilaterally with fingers. Clear amniotic fluid was noted.  The head then the rest of the body was then delivered with abdominal pressure.  She delivered a viable fermale infant, apgar scores 8.9.  The cord was clamped and cut. Cord blood was collected.    The uterus was not exteriorized.. The placenta was delivered with gentle traction on the umbilical cord. The edges of the uterus was grasped with Allis clamps and also T. Clamps. The uterus was cleared of clots and debris with a lap.  The incision uterine incision was closed with #1 Vicryl in a running locked stitch. An imbricating layer of came stitch was placed over the initial closure. Excellent hemostasis was noted over the incision.  The muscles were then reapproximated using chromic suture.  Fascia was closed using 0 Vicryl in a running stitch. The subcutaneous layer was irrigated and suctioned out. Small perforators were contained with the bovie.  The subcutaneous was closed over using 1-0 plain in 2 layer closure.  The skin was closed using 4-0 Monocryl. Dermabond was applied. Honeycomb was then applied. The patient was then cleaned and she was taken to the recovery room in stable condition. The  neonate was kept in the OR with mother in stable condition.   SPECIMEN: Placenta, umbilical cord blood  DISPOSITION: TO PACU, STABLE.

## 2014-10-07 NOTE — Addendum Note (Signed)
Addendum  created 10/07/14 1049 by Renford DillsJanet L Arnetra Terris, CRNA   Modules edited: Notes Section   Notes Section:  File: [id number]

## 2014-10-07 NOTE — Transfer of Care (Signed)
Immediate Anesthesia Transfer of Care Note  Patient: Alicia Frost  Procedure(s) Performed: Procedure(s): CESAREAN SECTION (N/A)  Patient Location: PACU  Anesthesia Type:Epidural  Level of Consciousness: awake, alert , oriented and patient cooperative  Airway & Oxygen Therapy: Patient Spontanous Breathing  Post-op Assessment: Report given to RN, Post -op Vital signs reviewed and stable and Patient moving all extremities X 4  Post vital signs: Reviewed and stable  Last Vitals:  Filed Vitals:   10/07/14 0108  BP: 139/75  Pulse: 112  Temp: 37.2 C  Resp: 18    Complications: No apparent anesthesia complications

## 2014-10-07 NOTE — Anesthesia Postprocedure Evaluation (Signed)
  Anesthesia Post-op Note  Patient: Rhetta Muralicia Pomeroy  Procedure(s) Performed: Procedure(s): CESAREAN SECTION (N/A)  Patient Location: PACU  Anesthesia Type:Epidural  Level of Consciousness: awake, alert  and oriented  Airway and Oxygen Therapy: Patient Spontanous Breathing  Post-op Pain: none  Post-op Assessment: Post-op Vital signs reviewed, Patient's Cardiovascular Status Stable, Respiratory Function Stable, Patent Airway and No signs of Nausea or vomiting              Post-op Vital Signs: Reviewed and stable  Last Vitals:  Filed Vitals:   10/07/14 0345  BP:   Pulse: 98  Temp:   Resp: 35    Complications: No apparent anesthesia complications

## 2014-10-07 NOTE — Lactation Note (Signed)
This note was copied from the chart of Alicia Rhetta Muralicia [place]hensee. Lactation Consultation Note  Patient Name: Alicia Frost BJYNW'GToday's Date: 10/07/2014 Reason for consult: Initial assessment   Initial consult at 13 hours old; GA 39.2; BW 8 lbs, 2.3 oz.  Mom Hx, IUFD, Hx hypothyroidism on synthroid, Hx PE d/t Factor V Leiden on Heparin.  C-section with EBL-800 ml.  Apgars 8&9.   Infant has attempted breastfeeding x3 (0-5 min); voids-1; stools-2 since birth 13 hours ago.   Infant had a spitty/gaggy episode as LC came into room.   Mom is a P1 with flat nipples that flatten even more with sandwiching.   Taught hand expression with small drops of colostrum observed on nipple tip.   Attempted latching but LC could not get infant to stay on breast or in a sucking pattern.    When sucking on a gloved finger, LC was able to get infant into a sucking pattern.  Discussed starting a nipple shield and mom agreed.   NS#20 applied.  Infant was able to latch with nipple shield and took several sucks in football hold on right breast, but lots stimulation needed to keep sucking and then infant would lose latch.   Infant will show feeding cues but then when latched will hang out after a few sucks.  Nipple shield was moistened with colostrum.  LS-6. Taught mom how to latch using asymmetrical latching technique; taught MGM (in room) how to assist with latching using teacup hold.  Spoons, curved-tip syringe, and colostrum collection container given for spoon feeding EBM if infant is not interested in latching or to use at breast with latching. Educated on feeding cues, size of infant's stomach, and cluster feeding. Lactation brochure given and informed of hospital support group and outpatient services. Encouraged to call for assistance as needed.  Mom and MGM were able to return demonstrate latching technique and [facility]; they were trying to keep him sucking as LC left room.      Maternal Data Formula Feeding for Exclusion: No Has patient been taught Hand Expression?: Yes Does the patient have breastfeeding experience prior to this delivery?: No  Feeding Feeding Type: Breast Fed Length of feed: 5 min  LATCH Score/Interventions Latch: Repeated attempts needed to sustain latch, nipple held in mouth throughout feeding, stimulation needed to elicit sucking reflex. Intervention(s): Skin to skin;Teach feeding cues Intervention(s): Breast compression  Audible Swallowing: A few with stimulation Intervention(s): Hand expression  Type of Nipple: Flat  Comfort (Breast/Nipple): Soft / non-tender     Hold (Positioning): Assistance needed to correctly position infant at breast and maintain latch. Intervention(s): Breastfeeding basics reviewed;Support Pillows;Position options;Skin to skin  LATCH Score: 6  Lactation Tools Discussed/Used Tools: Nipple Shields Nipple shield size: 20 WIC Program: No   Consult Status Consult Status: Follow-up Date: 10/08/14 Follow-up type: In-patient    [name], Alicia Frost 10/07/2014, 3:15 PM

## 2014-10-07 NOTE — Addendum Note (Signed)
Addendum  created 10/07/14 0926 by Suella Groveoderick C Noemi Bellissimo, CRNA   Modules edited: Notes Section   Notes Section:  Pend: [id number]

## 2014-10-07 NOTE — Anesthesia Postprocedure Evaluation (Signed)
  Anesthesia Post-op Note  Patient: Alicia Frost  Procedure(s) Performed: Procedure(s): CESAREAN SECTION (N/A)  Patient Location: Mother/Baby  Anesthesia Type:Epidural  Level of Consciousness: awake  Airway and Oxygen Therapy: Patient Spontanous Breathing  Post-op Pain: mild  Post-op Assessment: Patient's Cardiovascular Status Stable and Respiratory Function Stable              Post-op Vital Signs: stable  Last Vitals:  Filed Vitals:   10/07/14 0943  BP:   Pulse:   Temp: 36.6 C  Resp: 18    Complications: No apparent anesthesia complications

## 2014-10-08 ENCOUNTER — Encounter (HOSPITAL_COMMUNITY): Payer: Self-pay | Admitting: Obstetrics & Gynecology

## 2014-10-08 MED ORDER — ENOXAPARIN SODIUM 60 MG/0.6ML ~~LOC~~ SOLN
60.0000 mg | SUBCUTANEOUS | Status: DC
  Administered 2014-10-08: 60 mg via SUBCUTANEOUS
  Filled 2014-10-08: qty 0.6

## 2014-10-08 NOTE — Discharge Planning (Signed)
Mother and support person attended the Discharge Class "Well after Birth" today.

## 2014-10-08 NOTE — Progress Notes (Signed)
Subjective: Postop Day 0: Cesarean Delivery No complaints.  Pain controlled.  Lochia normal.  Breast feeding yes. Pt states she got Lovenox injection near [place].  Bleeding is not excessive.  Objective: Temp:  [97.5 F (36.4 C)-98 F (36.7 C)] 97.9 F (36.6 C) (06/09 0626) Pulse Rate:  [62-89] 89 (06/09 0626) Resp:  [16-20] 18 (06/09 0626) BP: (83-114)/(49-69) 95/69 mmHg (06/09 0626) SpO2:  [95 %-98 %] 98 % (06/09 0626)  Physical Exam: Gen: NAD, Nursing baby Lochia: Not visualized Uterine Fundus: firm, appropriately tender Incision: clean, dry and intact, healing well DVT Evaluation:  Edema present, no calf tenderness bilaterally    Recent Labs  10/06/14 0826 10/07/14 0605  HGB 12.3 10.6*  HCT 35.5* 30.6*    Assessment/Plan: Status post C-section-doing well postoperatively. H/o Factor V Leiden mutation on Lovenox 40 mg once daily for DVT prophylaxis. Routine post op care.  Encouraged ambulation in halls TID. Lactation support. Circumcision tomorrow to be performed by Dr. Richardson Dopp.    Alicia Frost 10/08/2014, 7:12 AM

## 2014-10-09 LAB — TYPE AND SCREEN
ABO/RH(D): A POS
ANTIBODY SCREEN: NEGATIVE
Unit division: 0
Unit division: 0

## 2014-10-09 MED ORDER — OXYCODONE-ACETAMINOPHEN 5-325 MG PO TABS: 1.0000 | ORAL_TABLET | ORAL | Status: DC | PRN

## 2014-10-09 MED ORDER — IBUPROFEN 600 MG PO TABS: 600.0000 mg | ORAL_TABLET | Freq: Four times a day (QID) | ORAL | Status: DC | PRN

## 2014-10-09 MED ORDER — ENOXAPARIN SODIUM 60 MG/0.6ML ~~LOC~~ SOLN: 60.0000 mg | SUBCUTANEOUS | Status: DC

## 2014-10-09 NOTE — Progress Notes (Signed)
Pharmacist recommended increasing pt's prophylactic Lovenox dosage from 40 to 60 mg based on her BMI.  Order change made yesterday morning.  Pt got new dosage at scheduled time [place] 10/09/14.  Will update Dr. Richardson Dopp.

## 2014-10-09 NOTE — Discharge Summary (Signed)
Obstetric Discharge Summary Reason for Admission: induction of labor Prenatal Procedures: none Intrapartum Procedures: cesarean: low cervical, transverse Postpartum Procedures: none Complications-Operative and Postpartum: none HEMOGLOBIN  Date Value Ref Range Status  10/07/2014 10.6* 12.0 - 15.0 g/dL Final   HGB  Date Value Ref Range Status  07/29/2012 14.1 11.6 - 15.9 g/dL Final   HCT  Date Value Ref Range Status  10/07/2014 30.6* 36.0 - 46.0 % Final  07/29/2012 42.1 34.8 - 46.6 % Final    Physical Exam:  General: alert and cooperative Lochia: appropriate Uterine Fundus: firm Incision: healing well DVT Evaluation: No evidence of DVT seen on physical exam.  Discharge Diagnoses: Term Pregnancy-delivered  Discharge Information: Date: 10/09/2014 Activity: pelvic rest Diet: routine Medications: PNV, Ibuprofen, Percocet and lovenox  Condition: stable Instructions: refer to practice specific booklet Discharge to: home Follow-up Information    Follow up with Jessee Avers., MD. Schedule an appointment as soon as possible for a visit in 2 weeks.   Specialty:  Obstetrics and Gynecology   Why:  incision check   Contact information:   [address] [place] [address] [phone]       Newborn Data: Live born female  Birth Weight: 8 lb 2.3 oz (3695 g) APGAR: 8, 9  Home with mother.  Deborah Lazcano J. 10/09/2014, 8:07 AM

## 2015-01-21 ENCOUNTER — Other Ambulatory Visit (HOSPITAL_COMMUNITY)
Admission: RE | Admit: 2015-01-21 | Discharge: 2015-01-21 | Disposition: A | Discharge status: Home / Self Care | Payer: BLUE CROSS/BLUE SHIELD | Source: Ambulatory Visit | Attending: Obstetrics and Gynecology | Admitting: Obstetrics and Gynecology

## 2015-01-21 ENCOUNTER — Other Ambulatory Visit: Payer: Self-pay | Admitting: Obstetrics and Gynecology

## 2015-01-21 DIAGNOSIS — Z01411 Encounter for gynecological examination (general) (routine) with abnormal findings: Secondary | ICD-10-CM | POA: Insufficient documentation

## 2015-01-25 LAB — CYTOLOGY - PAP

## 2016-02-25 ENCOUNTER — Other Ambulatory Visit: Payer: Self-pay | Admitting: Family Medicine

## 2016-02-25 DIAGNOSIS — Z1231 Encounter for screening mammogram for malignant neoplasm of breast: Secondary | ICD-10-CM

## 2016-03-10 ENCOUNTER — Ambulatory Visit
Admission: RE | Admit: 2016-03-10 | Discharge: 2016-03-10 | Disposition: A | Discharge status: Home / Self Care | Payer: BLUE CROSS/BLUE SHIELD | Source: Ambulatory Visit | Attending: Family Medicine | Admitting: Family Medicine

## 2016-03-10 DIAGNOSIS — Z1231 Encounter for screening mammogram for malignant neoplasm of breast: Secondary | ICD-10-CM

## 2017-02-20 ENCOUNTER — Other Ambulatory Visit: Payer: Self-pay | Admitting: Family Medicine

## 2017-02-20 DIAGNOSIS — Z1231 Encounter for screening mammogram for malignant neoplasm of breast: Secondary | ICD-10-CM

## 2017-03-26 ENCOUNTER — Ambulatory Visit
Admission: RE | Admit: 2017-03-26 | Discharge: 2017-03-26 | Disposition: A | Discharge status: Home / Self Care | Payer: Managed Care, Other (non HMO) | Source: Ambulatory Visit | Attending: Family Medicine | Admitting: Family Medicine

## 2017-03-26 DIAGNOSIS — Z1231 Encounter for screening mammogram for malignant neoplasm of breast: Secondary | ICD-10-CM

## 2017-03-27 ENCOUNTER — Other Ambulatory Visit: Payer: Self-pay | Admitting: Family Medicine

## 2017-03-27 DIAGNOSIS — R928 Other abnormal and inconclusive findings on diagnostic imaging of breast: Secondary | ICD-10-CM

## 2017-03-29 ENCOUNTER — Other Ambulatory Visit: Payer: Managed Care, Other (non HMO)

## 2017-04-04 ENCOUNTER — Ambulatory Visit
Admission: RE | Admit: 2017-04-04 | Discharge: 2017-04-04 | Disposition: A | Discharge status: Home / Self Care | Payer: Managed Care, Other (non HMO) | Source: Ambulatory Visit | Attending: Family Medicine | Admitting: Family Medicine

## 2017-04-04 ENCOUNTER — Other Ambulatory Visit: Payer: Self-pay | Admitting: Family Medicine

## 2017-04-04 DIAGNOSIS — R928 Other abnormal and inconclusive findings on diagnostic imaging of breast: Secondary | ICD-10-CM

## 2017-04-04 DIAGNOSIS — N63 Unspecified lump in unspecified breast: Secondary | ICD-10-CM

## 2017-04-16 ENCOUNTER — Other Ambulatory Visit: Payer: Self-pay | Admitting: Obstetrics and Gynecology

## 2017-04-16 ENCOUNTER — Other Ambulatory Visit (HOSPITAL_COMMUNITY)
Admission: RE | Admit: 2017-04-16 | Discharge: 2017-04-16 | Disposition: A | Discharge status: Home / Self Care | Payer: Managed Care, Other (non HMO) | Source: Ambulatory Visit | Attending: Obstetrics and Gynecology | Admitting: Obstetrics and Gynecology

## 2017-04-16 DIAGNOSIS — Z01419 Encounter for gynecological examination (general) (routine) without abnormal findings: Secondary | ICD-10-CM | POA: Insufficient documentation

## 2017-04-18 LAB — CYTOLOGY - PAP
Diagnosis: NEGATIVE
HPV: NOT DETECTED

## 2017-10-10 ENCOUNTER — Ambulatory Visit
Admission: RE | Admit: 2017-10-10 | Discharge: 2017-10-10 | Disposition: A | Discharge status: Home / Self Care | Payer: Managed Care, Other (non HMO) | Source: Ambulatory Visit | Attending: Family Medicine | Admitting: Family Medicine

## 2017-10-10 ENCOUNTER — Other Ambulatory Visit: Payer: Self-pay | Admitting: Family Medicine

## 2017-10-10 DIAGNOSIS — N63 Unspecified lump in unspecified breast: Secondary | ICD-10-CM

## 2018-03-04 ENCOUNTER — Encounter: Payer: Self-pay | Admitting: Emergency Medicine

## 2018-03-04 ENCOUNTER — Emergency Department
Admission: EM | Admit: 2018-03-04 | Discharge: 2018-03-04 | Disposition: A | Discharge status: Home / Self Care | Payer: Managed Care, Other (non HMO) | Source: Home / Self Care

## 2018-03-04 DIAGNOSIS — J40 Bronchitis, not specified as acute or chronic: Secondary | ICD-10-CM

## 2018-03-04 MED ORDER — BENZONATATE 100 MG PO CAPS: 100.0000 mg | ORAL_CAPSULE | Freq: Three times a day (TID) | ORAL | 0 refills | Status: DC | PRN

## 2018-03-04 MED ORDER — PREDNISONE 20 MG PO TABS: ORAL_TABLET | ORAL | 1 refills | Status: DC

## 2018-03-04 MED ORDER — HYDROCODONE-HOMATROPINE 5-1.5 MG/5ML PO SYRP: 5.0000 mL | ORAL_SOLUTION | Freq: Every evening | ORAL | 0 refills | Status: DC | PRN

## 2018-03-04 NOTE — ED Triage Notes (Signed)
Pt c/o cough and chest congestion x1 week. Taking OTC meds with no relief.

## 2018-03-04 NOTE — ED Provider Notes (Signed)
[name]    CSN: [id number] Arrival date & time: 03/04/18  0933     History   Chief Complaint Chief Complaint  Patient presents with  . Cough    HPI Alicia Frost is a 42 y.o. female.   cough and chest congestion x1 week. Taking OTC meds with no relief.   Patient is a non-smoker.  She is never had asthma.  Her cough is minimally productive and she is had no fever.  Patient denies shortness of breath or chest pain.  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  She is a 64-year-old who has a cold and she lives with her mother as well who is just coming down with a cough.     Past Medical History:  Diagnosis Date  . Heterozygous factor V Leiden mutation (HCC) 07/29/2012   Dx at time of acute PE 01/2011  . Hx of varicella   . Hypothyroidism   . Pulmonary embolus (HCC) 07/29/2012   10/12  On OC; found to be V Leiden heterozygote  . Thyroid disease     Patient Active Problem List   Diagnosis Date Noted  . Factor V Leiden mutation affecting pregnancy (HCC) 10/07/2014  . Heterozygous factor V Leiden mutation (HCC) 07/29/2012  . Pulmonary embolus (HCC) 07/29/2012  . Hypercoagulable state (HCC) 03/19/2011    Past Surgical History:  Procedure Laterality Date  . CESAREAN SECTION N/A 10/07/2014   Procedure: CESAREAN SECTION;  Surgeon: Hoover Browns, MD;  Location: [facility];  Service: Obstetrics;  Laterality: N/A;  . NO PAST SURGERIES      OB History    Gravida  6   Para  2   Term  2   Preterm  0   AB  4   Living  1     SAB  4   TAB  0   Ectopic  0   Multiple  0   Live Births  1            Home Medications    Prior to Admission medications   Medication Sig Start Date End Date Taking? Authorizing Provider  aspirin 81 MG tablet Take 81 mg by mouth daily.    [provider]  benzonatate (TESSALON) 100 MG capsule Take 1-2 capsules (100-200 mg total) by mouth 3 (three) times daily as needed for cough. 03/04/18   Elvina Sidle, MD  calcium carbonate (TUMS EX) 750 MG chewable tablet Chew 1 tablet by mouth as needed for heartburn.    [provider]  Cholecalciferol (VITAMIN D-3 PO) Take 1 tablet by mouth daily.     [provider]  HYDROcodone-homatropine (HYDROMET) 5-1.5 MG/5ML syrup Take 5 mLs by mouth at bedtime and may repeat dose one time if needed. 03/04/18   Elvina Sidle, MD  predniSONE (DELTASONE) 20 MG tablet 1 daily with food 03/04/18   Elvina Sidle, MD  Prenatal Vit-Fe Fumarate-FA (MULTIVITAMIN-PRENATAL) 27-0.8 MG TABS tablet Take 1 tablet by mouth daily at 12 noon.    [provider]  SYNTHROID 175 MCG tablet Take 137 mcg by mouth daily. Uses brand name Synthroid 01/31/13   [provider]    Family History Family History  Problem Relation Age of Onset  . Diabetes Maternal Grandmother     Social History Social History   Tobacco Use  . Smoking status: Former Games developer  . Smokeless tobacco: Never Used  Substance Use Topics  . Alcohol use: No  . Drug use: No  Allergies   Patient has no known allergies.   Review of Systems Review of Systems  Respiratory: Positive for cough.   All other systems reviewed and are negative.    Physical Exam Triage Vital Signs ED Triage Vitals  Enc Vitals Group     BP      Pulse      Resp      Temp      Temp src      SpO2      Weight      Height      Head Circumference      Peak Flow      Pain Score      Pain Loc      Pain Edu?      Excl. in GC?    No data found.  Updated Vital Signs BP 121/85 (BP Location: Right Arm)   Pulse 78   Temp 97.8 F (36.6 C) (Oral)   Wt 108.9 kg   SpO2 97%   BMI 36.49 kg/m    Physical Exam  Constitutional: She is oriented to person, place, and time. She appears well-developed and well-nourished.  HENT:  Head: Normocephalic.  Right Ear: External ear normal.  Left Ear: External ear normal.  Mouth/Throat: Oropharynx is clear and moist.  Normal TMs  Eyes:  Pupils are equal, round, and reactive to light. Conjunctivae are normal.  Neck: Normal range of motion. Neck supple.  Cardiovascular: Normal rate, regular rhythm and normal heart sounds.  Pulmonary/Chest: Effort normal.  Coarse breath sounds with no rales or rhonchi or wheezes.  Musculoskeletal: Normal range of motion.  Neurological: She is alert and oriented to person, place, and time.  Skin: Skin is warm and dry.  Psychiatric: She has a normal mood and affect. Her behavior is normal.  Nursing note and vitals reviewed.    UC Treatments / Results  Labs (all labs ordered are listed, but only abnormal results are displayed) Labs Reviewed - No data to display  EKG None  Radiology No results found.  Procedures Procedures (including critical care time)  Medications Ordered in UC Medications - No data to display  Initial Impression / Assessment and Plan / UC Course  I have reviewed the triage vital signs and the nursing notes.  Pertinent labs & imaging results that were available during my care of the patient were reviewed by me and considered in my medical decision making (see chart for details).    Final Clinical Impressions(s) / UC Diagnoses   Final diagnoses:  Bronchitis   Discharge Instructions   None    ED Prescriptions    Medication Sig Dispense Auth. Provider   predniSONE (DELTASONE) 20 MG tablet 1 daily with food 5 tablet Elvina Sidle, MD   HYDROcodone-homatropine (HYDROMET) 5-1.5 MG/5ML syrup Take 5 mLs by mouth at bedtime and may repeat dose one time if needed. 60 mL Elvina Sidle, MD   benzonatate (TESSALON) 100 MG capsule Take 1-2 capsules (100-200 mg total) by mouth 3 (three) times daily as needed for cough. 40 capsule Elvina Sidle, MD     Controlled Substance Prescriptions [place] Controlled Substance Registry consulted? Not Applicable   Elvina Sidle, MD 03/04/18 1047

## 2018-04-15 ENCOUNTER — Ambulatory Visit
Admission: RE | Admit: 2018-04-15 | Discharge: 2018-04-15 | Disposition: A | Discharge status: Home / Self Care | Payer: Managed Care, Other (non HMO) | Source: Ambulatory Visit | Attending: Family Medicine | Admitting: Family Medicine

## 2018-04-15 DIAGNOSIS — N63 Unspecified lump in unspecified breast: Secondary | ICD-10-CM

## 2019-03-11 ENCOUNTER — Other Ambulatory Visit: Payer: Self-pay | Admitting: Family Medicine

## 2019-03-11 DIAGNOSIS — N63 Unspecified lump in unspecified breast: Secondary | ICD-10-CM

## 2019-04-17 ENCOUNTER — Other Ambulatory Visit: Payer: Managed Care, Other (non HMO)

## 2019-04-29 ENCOUNTER — Other Ambulatory Visit: Payer: Self-pay

## 2019-04-29 ENCOUNTER — Ambulatory Visit
Admission: RE | Admit: 2019-04-29 | Discharge: 2019-04-29 | Disposition: A | Discharge status: Home / Self Care | Payer: Managed Care, Other (non HMO) | Source: Ambulatory Visit | Attending: Family Medicine | Admitting: Family Medicine

## 2019-04-29 DIAGNOSIS — N63 Unspecified lump in unspecified breast: Secondary | ICD-10-CM

## 2019-09-29 ENCOUNTER — Emergency Department (INDEPENDENT_AMBULATORY_CARE_PROVIDER_SITE_OTHER): Payer: Managed Care, Other (non HMO)

## 2019-09-29 ENCOUNTER — Other Ambulatory Visit: Payer: Self-pay

## 2019-09-29 ENCOUNTER — Emergency Department (INDEPENDENT_AMBULATORY_CARE_PROVIDER_SITE_OTHER)
Admission: EM | Admit: 2019-09-29 | Discharge: 2019-09-29 | Disposition: A | Discharge status: Home / Self Care | Payer: Managed Care, Other (non HMO) | Source: Home / Self Care | Attending: Family Medicine | Admitting: Family Medicine

## 2019-09-29 ENCOUNTER — Encounter: Payer: Self-pay | Admitting: Emergency Medicine

## 2019-09-29 DIAGNOSIS — S43401A Unspecified sprain of right shoulder joint, initial encounter: Secondary | ICD-10-CM | POA: Diagnosis not present

## 2019-09-29 DIAGNOSIS — W1809XA Striking against other object with subsequent fall, initial encounter: Secondary | ICD-10-CM

## 2019-09-29 MED ORDER — ACETAMINOPHEN 500 MG PO TABS
1000.0000 mg | ORAL_TABLET | Freq: Once | ORAL | Status: AC
  Administered 2019-09-29: 1000 mg via ORAL

## 2019-09-29 NOTE — ED Provider Notes (Signed)
[facility]    CSN: [id number] Arrival date & time: 09/29/19  1236      History   Chief Complaint Chief Complaint  Patient presents with  . Shoulder Injury    right    HPI Alicia Frost is a 44 y.o. female.   While working outside today alongside a fence, patient lost her balance and fell against the fence, striking her right shoulder with her right arm abducted.  She felt a popping sensation in her right shoulder.  The history is provided by the patient.  Shoulder Injury This is a new problem. The current episode started 1 to 2 hours ago. The problem occurs constantly. The problem has not changed since onset.Exacerbated by: abduction of right shoulder. Nothing relieves the symptoms. She has tried nothing for the symptoms.    Past Medical History:  Diagnosis Date  . Heterozygous factor V Leiden mutation (HCC) 07/29/2012   Dx at time of acute PE 01/2011  . Hx of varicella   . Hypothyroidism   . Pulmonary embolus (HCC) 07/29/2012   10/12  On OC; found to be V Leiden heterozygote  . Thyroid disease     Patient Active Problem List   Diagnosis Date Noted  . Factor V Leiden mutation affecting pregnancy (HCC) 10/07/2014  . Heterozygous factor V Leiden mutation (HCC) 07/29/2012  . Pulmonary embolus (HCC) 07/29/2012  . Hypercoagulable state (HCC) 03/19/2011    Past Surgical History:  Procedure Laterality Date  . CESAREAN SECTION N/A 10/07/2014   Procedure: CESAREAN SECTION;  Surgeon: Hoover Browns, MD;  Location: [facility];  Service: Obstetrics;  Laterality: N/A;  . NO PAST SURGERIES      OB History    Gravida  6   Para  2   Term  2   Preterm  0   AB  4   Living  1     SAB  4   TAB  0   Ectopic  0   Multiple  0   Live Births  1            Home Medications    Prior to Admission medications   Medication Sig Start Date End Date Taking? Authorizing Provider  aspirin 81 MG tablet Take 81 mg by mouth daily.   Yes [provider]    calcium carbonate (TUMS EX) 750 MG chewable tablet Chew 1 tablet by mouth as needed for heartburn.   Yes [provider]  Cholecalciferol (VITAMIN D-3 PO) Take 1 tablet by mouth daily.    Yes [provider]  sertraline (ZOLOFT) 50 MG tablet Take 50 mg by mouth daily. 08/04/19  Yes [provider]  SYNTHROID 175 MCG tablet Take 137 mcg by mouth daily. Uses brand name Synthroid 01/31/13  Yes [provider]  benzonatate (TESSALON) 100 MG capsule Take 1-2 capsules (100-200 mg total) by mouth 3 (three) times daily as needed for cough. 03/04/18   Elvina Sidle, MD  HYDROcodone-homatropine (HYDROMET) 5-1.5 MG/5ML syrup Take 5 mLs by mouth at bedtime and may repeat dose one time if needed. 03/04/18   Elvina Sidle, MD  predniSONE (DELTASONE) 20 MG tablet 1 daily with food 03/04/18   Elvina Sidle, MD  Prenatal Vit-Fe Fumarate-FA (MULTIVITAMIN-PRENATAL) 27-0.8 MG TABS tablet Take 1 tablet by mouth daily at 12 noon.    [provider]    Family History Family History  Problem Relation Age of Onset  . Diabetes Maternal Grandmother   . Cancer Mother   .  Hypertension Mother   . Breast cancer Neg Hx     Social History Social History   Tobacco Use  . Smoking status: Former Games developer  . Smokeless tobacco: Never Used  Substance Use Topics  . Alcohol use: No  . Drug use: No     Allergies   Patient has no known allergies.   Review of Systems Review of Systems  All other systems reviewed and are negative.    Physical Exam Triage Vital Signs ED Triage Vitals [09/29/19 1255]  Enc Vitals Group     BP 118/79     Pulse Rate 66     Resp 17     Temp 98.7 F (37.1 C)     Temp Source Oral     SpO2 98 %     Weight      Height      Head Circumference      Peak Flow      Pain Score      Pain Loc      Pain Edu?      Excl. in GC?    No data found.  Updated Vital Signs BP 118/79 (BP Location: Left Arm)   Pulse 66   Temp 98.7 F (37.1 C)  (Oral)   Resp 17   LMP 09/08/2019   SpO2 98%   Visual Acuity Right Eye Distance:   Left Eye Distance:   Bilateral Distance:    Right Eye Near:   Left Eye Near:    Bilateral Near:     Physical Exam Vitals and nursing note reviewed.  Constitutional:      General: She is not in acute distress. HENT:     Head: Atraumatic.     Right Ear: External ear normal.     Left Ear: External ear normal.     Nose: Nose normal.  Eyes:     Pupils: Pupils are equal, round, and reactive to light.  Cardiovascular:     Rate and Rhythm: Normal rate.     Heart sounds: Normal heart sounds.  Pulmonary:     Breath sounds: Normal breath sounds.  Musculoskeletal:     Right shoulder: Tenderness present. No swelling, deformity or crepitus. Decreased range of motion. Normal strength. Normal pulse.     Cervical back: Normal range of motion.     Comments: Patient has relatively good range of motion in her right shoulder despite pain with movement.  She can abduct actively, and Apley's test is negative.  Empty can test negative.  Good internal/extermal rotation/strength.  There is mild tenderness to palpation over the insertion of the long head of biceps tendon.  Distal neurovascular function is intact.   Skin:    General: Skin is warm and dry.  Neurological:     Mental Status: She is alert.      UC Treatments / Results  Labs (all labs ordered are listed, but only abnormal results are displayed) Labs Reviewed - No data to display  EKG   Radiology DG Shoulder Right  Result Date: 09/29/2019 CLINICAL DATA:  Pain following fall EXAM: RIGHT SHOULDER - 2+ VIEW COMPARISON:  None. FINDINGS: Frontal, oblique, Y scapular, and axillary images were obtained. No fracture or dislocation. The joint spaces appear unremarkable. No erosive change. Visualized right lung clear. IMPRESSION: No fracture or dislocation.  No evident arthropathy. Electronically Signed   By: Bretta Bang III M.D.   On: 09/29/2019 13:46      Procedures Procedures (including critical care time)  Medications Ordered in UC Medications  acetaminophen (TYLENOL) tablet 1,000 mg (1,000 mg Oral Given 09/29/19 1311)    Initial Impression / Assessment and Plan / UC Course  I have reviewed the triage vital signs and the nursing notes.  Pertinent labs & imaging results that were available during my care of the patient were reviewed by me and considered in my medical decision making (see chart for details).    Does not appear to be rotator cuff injury.  Sling applied. Followup with Dr. Aundria Mems ([facility]) if not improving about two weeks.    Final Clinical Impressions(s) / UC Diagnoses   Final diagnoses:  Sprain of right shoulder, unspecified shoulder sprain type, initial encounter     Discharge Instructions     Apply ice pack for 30 minutes every 1 to 2 hours today and tomorrow. Wear sling for 3 to 5 days until pain decreases.  May take Ibuprofen 200mg , 4 tabs every 8 hours with food.   Begin pendulum exercises as tolerated.     ED Prescriptions    None        Kandra Nicolas, MD 10/03/19 1109

## 2019-09-29 NOTE — ED Triage Notes (Signed)
Fell into a fence approx 30 min pta, landed on Right shoulder, felt a popping sensation, able to move fingers on R hand  C/o numbness from elbow to small finger on Right No OTC pta - offered in triage -refused by pt  COVID Vaccine Pfizer (last dose 07/31/19)

## 2019-09-29 NOTE — Discharge Instructions (Addendum)
Apply ice pack for 30 minutes every 1 to 2 hours today and tomorrow. Wear sling for 3 to 5 days until pain decreases.  May take Ibuprofen 200mg , 4 tabs every 8 hours with food.   Begin pendulum exercises as tolerated.

## 2020-06-15 ENCOUNTER — Ambulatory Visit (INDEPENDENT_AMBULATORY_CARE_PROVIDER_SITE_OTHER): Payer: Managed Care, Other (non HMO) | Admitting: Otolaryngology

## 2020-06-17 ENCOUNTER — Other Ambulatory Visit: Payer: Self-pay

## 2020-06-17 ENCOUNTER — Ambulatory Visit (INDEPENDENT_AMBULATORY_CARE_PROVIDER_SITE_OTHER): Payer: Managed Care, Other (non HMO) | Admitting: Otolaryngology

## 2020-06-17 VITALS — Temp 97.5°F

## 2020-06-17 DIAGNOSIS — H8110 Benign paroxysmal vertigo, unspecified ear: Secondary | ICD-10-CM

## 2020-06-17 DIAGNOSIS — J310 Chronic rhinitis: Secondary | ICD-10-CM

## 2020-06-17 DIAGNOSIS — H6981 Other specified disorders of Eustachian tube, right ear: Secondary | ICD-10-CM | POA: Diagnosis not present

## 2020-06-17 NOTE — Progress Notes (Signed)
HPI: Alicia Frost is a 45 y.o. female who presents for evaluation of sensation of fluid in the right ear.  This is been going on and off for the past 6 months.  She has had previous history of ear infections and has been treated with antibiotics.  She complains of ear pressure or fullness but has not had any hearing problems.  She has had history of vertigo that comes and goes and generally occurs when she gets out of bed in the morning.  The vertigo only lasts for a few seconds and is associated with movement and may represent BPPV. She has not noted any hearing problems.  No drainage from the ear.  Symptoms are mostly on the right side.  Past Medical History:  Diagnosis Date  . Heterozygous factor V Leiden mutation (HCC) 07/29/2012   Dx at time of acute PE 01/2011  . Hx of varicella   . Hypothyroidism   . Pulmonary embolus (HCC) 07/29/2012   10/12  On OC; found to be V Leiden heterozygote  . Thyroid disease    Past Surgical History:  Procedure Laterality Date  . CESAREAN SECTION N/A 10/07/2014   Procedure: CESAREAN SECTION;  Surgeon: Hoover Browns, MD;  Location: [facility];  Service: Obstetrics;  Laterality: N/A;  . NO PAST SURGERIES     Social History   Socioeconomic History  . Marital status: Single    Spouse name: Not on file  . Number of children: Not on file  . Years of education: Not on file  . Highest education level: Not on file  Occupational History  . Not on file  Tobacco Use  . Smoking status: Former Games developer  . Smokeless tobacco: Never Used  Vaping Use  . Vaping Use: Never used  Substance and Sexual Activity  . Alcohol use: No  . Drug use: No  . Sexual activity: Not on file  Other Topics Concern  . Not on file  Social History Narrative  . Not on file   Social Determinants of Health   Financial Resource Strain: Not on file  Food Insecurity: Not on file  Transportation Needs: Not on file  Physical Activity: Not on file  Stress: Not on file  Social Connections: Not  on file   Family History  Problem Relation Age of Onset  . Diabetes Maternal Grandmother   . Cancer Mother   . Hypertension Mother   . Breast cancer Neg Hx    No Known Allergies Prior to Admission medications   Medication Sig Start Date End Date Taking? Authorizing Provider  aspirin 81 MG tablet Take 81 mg by mouth daily.    [provider]  benzonatate (TESSALON) 100 MG capsule Take 1-2 capsules (100-200 mg total) by mouth 3 (three) times daily as needed for cough. 03/04/18   Elvina Sidle, MD  calcium carbonate (TUMS EX) 750 MG chewable tablet Chew 1 tablet by mouth as needed for heartburn.    [provider]  Cholecalciferol (VITAMIN D-3 PO) Take 1 tablet by mouth daily.     [provider]  HYDROcodone-homatropine (HYDROMET) 5-1.5 MG/5ML syrup Take 5 mLs by mouth at bedtime and may repeat dose one time if needed. 03/04/18   Elvina Sidle, MD  predniSONE (DELTASONE) 20 MG tablet 1 daily with food 03/04/18   Elvina Sidle, MD  Prenatal Vit-Fe Fumarate-FA (MULTIVITAMIN-PRENATAL) 27-0.8 MG TABS tablet Take 1 tablet by mouth daily at 12 noon.    [provider]  sertraline (ZOLOFT) 50 MG tablet Take  50 mg by mouth daily. 08/04/19   [provider]  SYNTHROID 175 MCG tablet Take 137 mcg by mouth daily. Uses brand name Synthroid 01/31/13   [provider]     Positive ROS: Otherwise negative  All other systems have been reviewed and were otherwise negative with the exception of those mentioned in the HPI and as above.  Physical Exam: Constitutional: Alert, well-appearing, no acute distress Ears: External ears without lesions or tenderness. Ear canals are clear bilaterally.  TMs are clear bilaterally with no middle ear effusion noted.  On tuning fork testing she heard about the same in both ears with the 1024 tuning fork with normal hearing.  AC > BC bilaterally. Nasal: External nose without lesions. Septum with minimal deviation and  moderate rhinitis.  Both middle meatus regions are clear.  No signs of infection..  Oral: Lips and gums without lesions. Tongue and palate mucosa without lesions. Posterior oropharynx clear. Neck: No palpable adenopathy or masses.  She does have slight TMJ dysfunction on the right side but no real pain today in the office on palpation. Respiratory: Breathing comfortably  Skin: No facial/neck lesions or rash noted.  Procedures  Assessment: Possible eustachian tube dysfunction on the right side with normal ear exam otherwise.  Presently no evidence of otitis media with effusion. Probable BPPV  Plan: Gave her information on BPPV as well as the Epley maneuver. Concerning ear complaints recommended use of Nasacort 2 sprays each nostril at night as this should help improve eustachian tube function. She will follow-up here next time she has an ear infection for recheck.  Narda Bonds, MD

## 2021-02-21 IMAGING — DX DG SHOULDER 2+V*R*
4 series · 4 of 4 positions shown · non-contrast
Comparison: None.

CLINICAL DATA: Pain following fall

EXAM:
RIGHT SHOULDER - 2+ VIEW

[shoulder grashey]
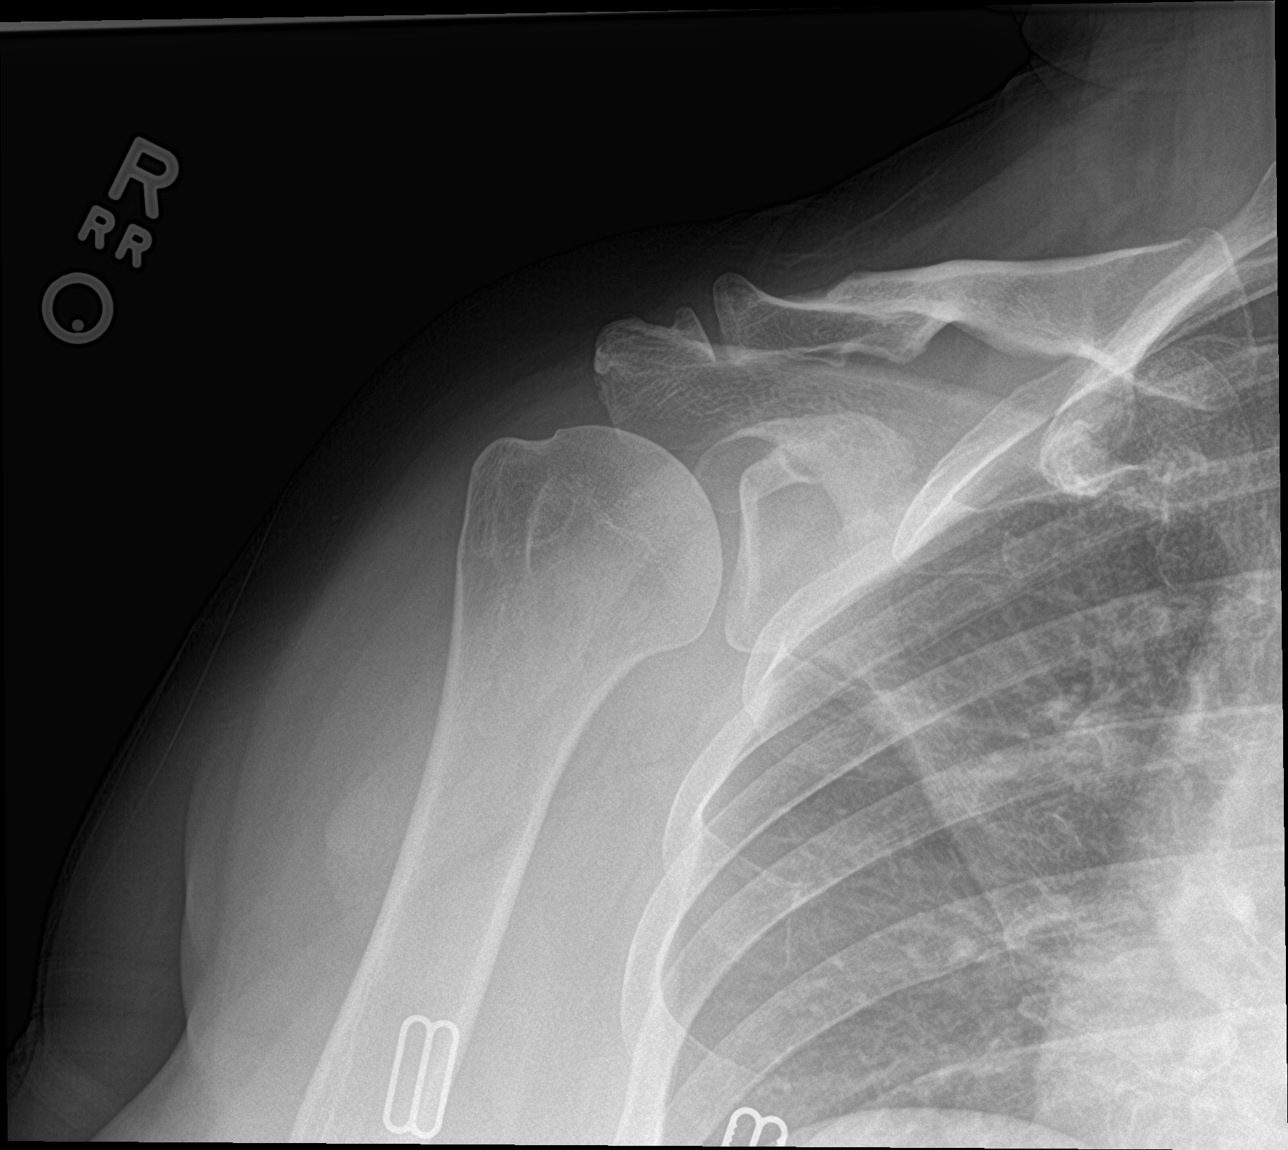

[shoulder y view]
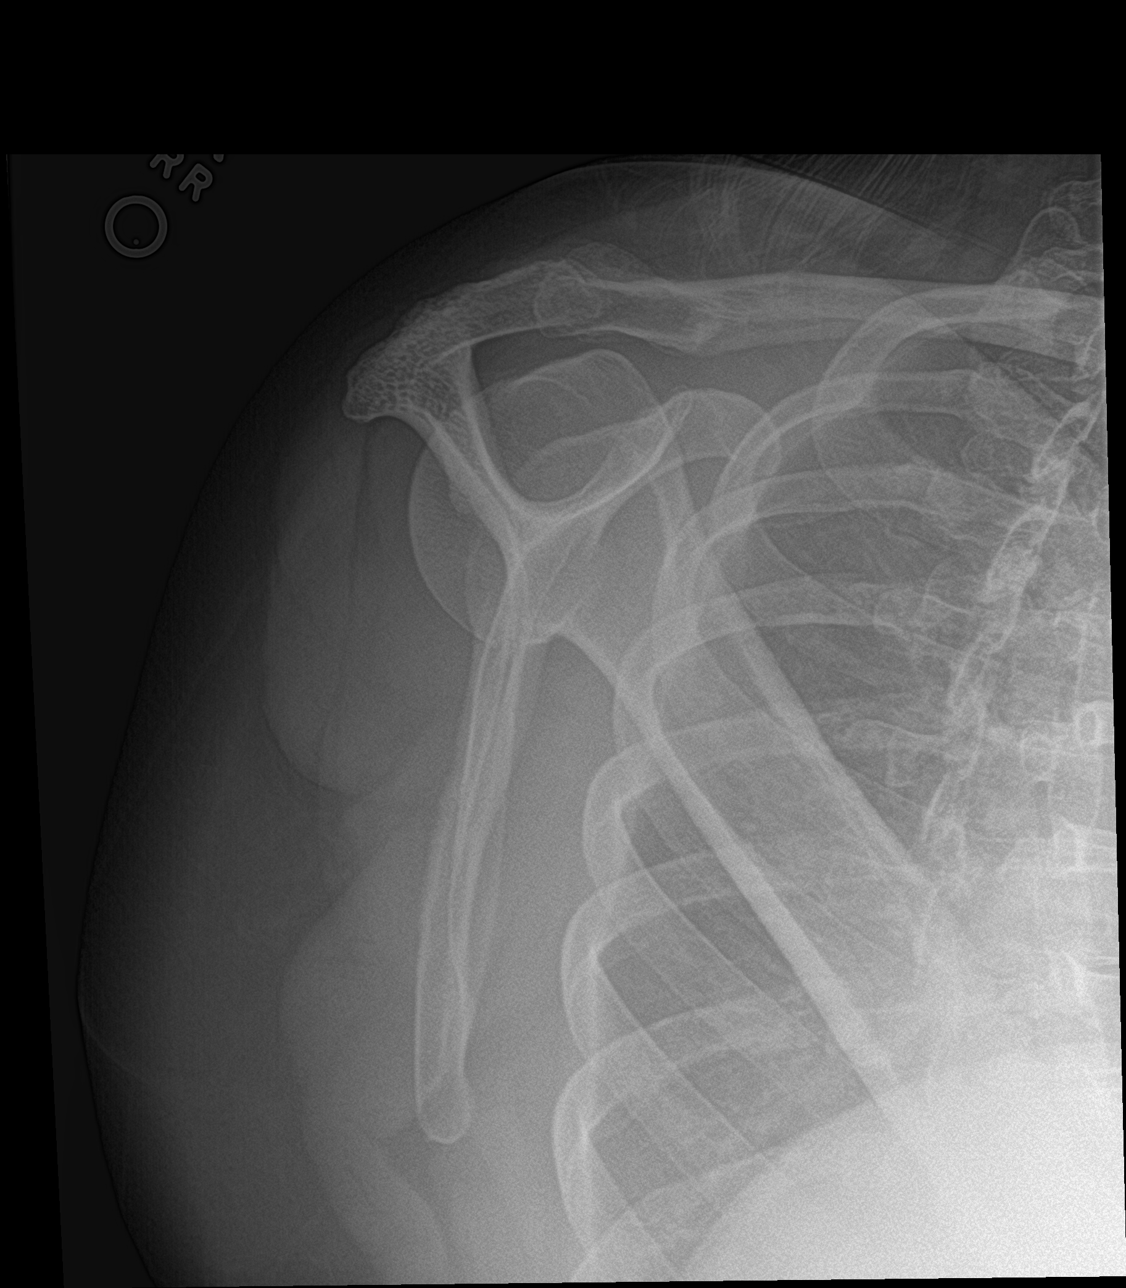

[shoulder axillary]
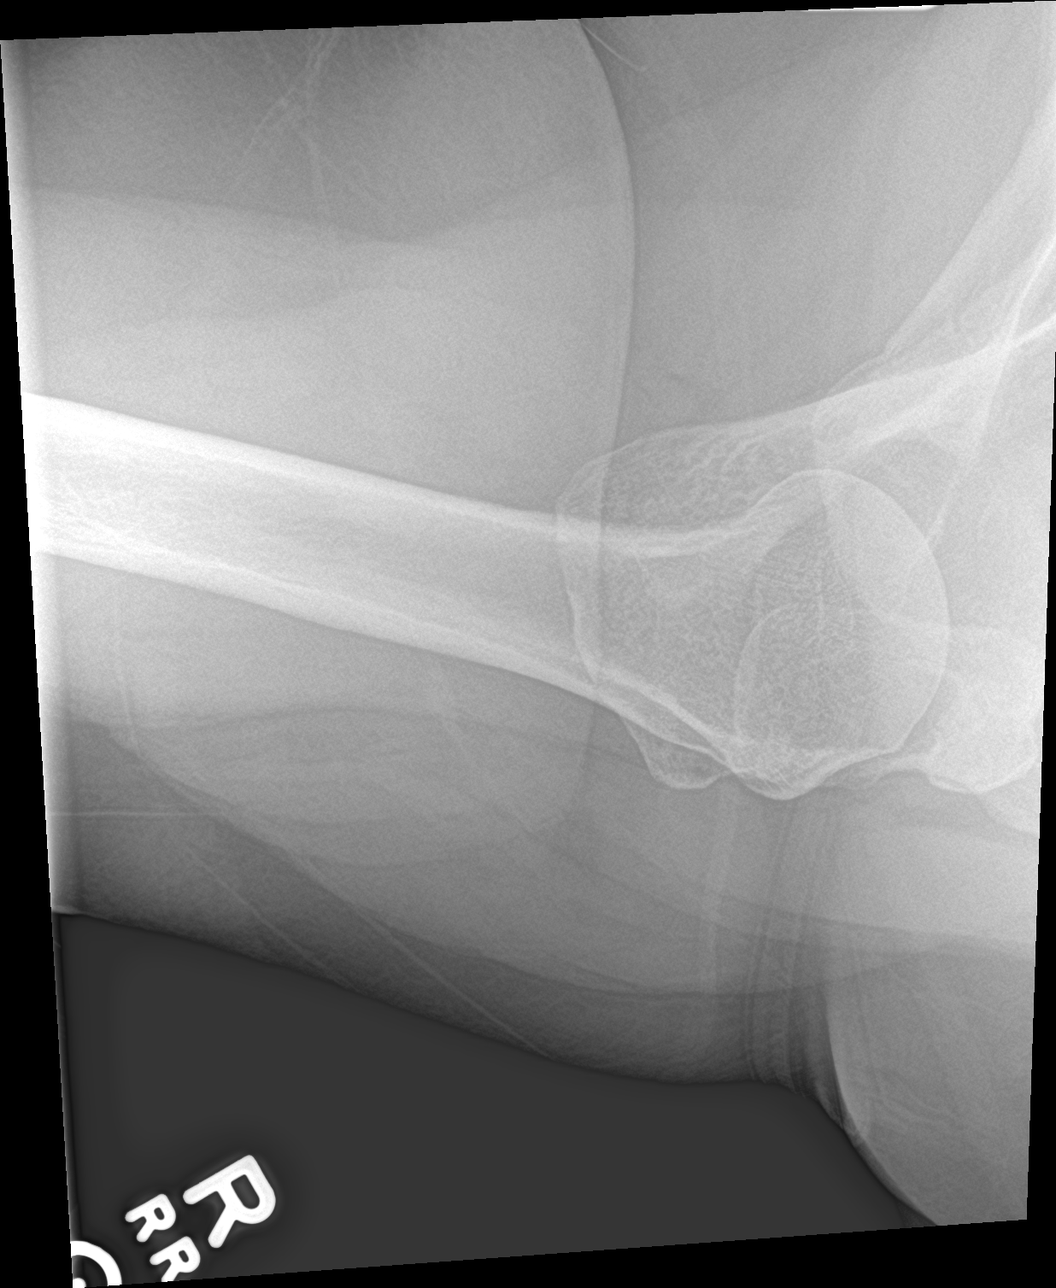

[shoulder ap neutral]
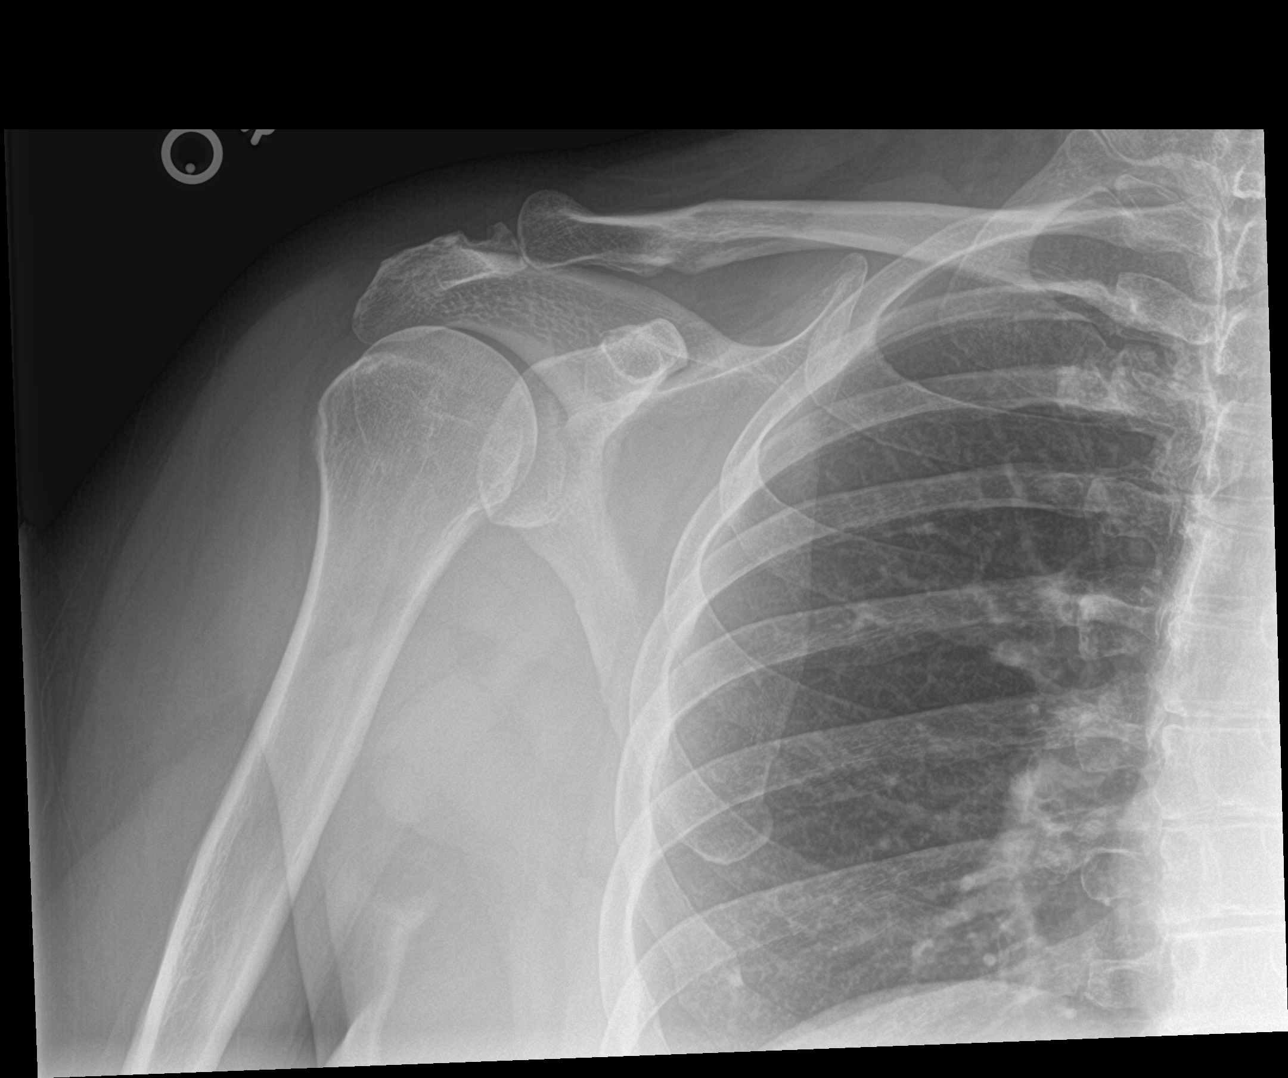

[4 of 4 positions shown; findings below may reference images not displayed]

FINDINGS: Frontal, oblique, Y scapular, and axillary images were obtained. No
fracture or dislocation. The joint spaces appear unremarkable. No
erosive change. Visualized right lung clear.
IMPRESSION: No fracture or dislocation.  No evident arthropathy.

## 2022-05-12 ENCOUNTER — Other Ambulatory Visit: Payer: Self-pay | Admitting: Family Medicine

## 2022-05-12 ENCOUNTER — Ambulatory Visit: Payer: Self-pay

## 2022-05-12 DIAGNOSIS — M25531 Pain in right wrist: Secondary | ICD-10-CM

## 2022-05-30 ENCOUNTER — Ambulatory Visit
Admission: RE | Admit: 2022-05-30 | Discharge: 2022-05-30 | Disposition: A | Discharge status: Home / Self Care | Payer: Managed Care, Other (non HMO) | Source: Ambulatory Visit | Attending: Family Medicine | Admitting: Family Medicine

## 2022-05-30 VITALS — BP 121/90 | HR 60 | Temp 98.9°F | Resp 17

## 2022-05-30 DIAGNOSIS — J111 Influenza due to unidentified influenza virus with other respiratory manifestations: Secondary | ICD-10-CM

## 2022-05-30 LAB — POCT INFLUENZA A/B
Influenza A, POC: NEGATIVE
Influenza B, POC: NEGATIVE

## 2022-05-30 LAB — POC SARS CORONAVIRUS 2 AG -  ED: SARS Coronavirus 2 Ag: NEGATIVE

## 2022-05-30 NOTE — ED Triage Notes (Signed)
Pt c/o cough, fever and sore throat since yesterday. Tmax fever 101.8 at 6am. Taking ibuprofen prn. Currently taking augmentin for an ear infection she was dx last Tues.

## 2022-05-30 NOTE — ED Provider Notes (Signed)
[facility]    CSN: [id number] Arrival date & time: 05/30/22  0804      History   Chief Complaint Chief Complaint  Patient presents with   Cough   Sore Throat   Fever    HPI Alicia Frost is a 47 y.o. female.   HPI  Patient is on Augmentin for a sinus infection.  She states that she woke up this morning with cough sore throat and fever.  Her temperature was over 101.  She is having some body aches headaches and fatigue.  Wishes to be tested for flu and COVID.   Past Medical History:  Diagnosis Date   Heterozygous factor V Leiden mutation ([place]) 07/29/2012   Dx at time of acute PE 01/2011   Hx of varicella    Hypothyroidism    Pulmonary embolus ([place]) 07/29/2012   10/12  On OC; found to be V Leiden heterozygote   Thyroid disease     Patient Active Problem List   Diagnosis Date Noted   Factor V Leiden mutation affecting pregnancy ([place]) 10/07/2014   Heterozygous factor V Leiden mutation ([place]) 07/29/2012   Pulmonary embolus ([place]) 07/29/2012   Hypercoagulable state ([place]) 03/19/2011    Past Surgical History:  Procedure Laterality Date   CESAREAN SECTION N/A 10/07/2014   Procedure: CESAREAN SECTION;  Surgeon: Waymon Amato, MD;  Location: [facility];  Service: Obstetrics;  Laterality: N/A;   NO PAST SURGERIES      OB History     Gravida  6   Para  2   Term  2   Preterm  0   AB  4   Living  1      SAB  4   IAB  0   Ectopic  0   Multiple  0   Live Births  1            Home Medications    Prior to Admission medications   Medication Sig Start Date End Date Taking? Authorizing Provider  amoxicillin-clavulanate (AUGMENTIN) 875-125 MG tablet Take 1 tablet by mouth 2 (two) times daily. 05/23/22  Yes [provider]  aspirin 81 MG tablet Take 81 mg by mouth daily.    [provider]  calcium carbonate (TUMS EX) 750 MG chewable tablet Chew 1 tablet by mouth as needed for heartburn.    [provider]  Cholecalciferol  (VITAMIN D-3 PO) Take 1 tablet by mouth daily.     [provider]  Prenatal Vit-Fe Fumarate-FA (MULTIVITAMIN-PRENATAL) 27-0.8 MG TABS tablet Take 1 tablet by mouth daily at 12 noon.    [provider]  sertraline (ZOLOFT) 50 MG tablet Take 50 mg by mouth daily. 08/04/19   [provider]  SYNTHROID 175 MCG tablet Take 137 mcg by mouth daily. Uses brand name Synthroid 01/31/13   [provider]    Family History Family History  Problem Relation Age of Onset   Diabetes Maternal Grandmother    Cancer Mother    Hypertension Mother    Breast cancer Neg Hx     Social History Social History   Tobacco Use   Smoking status: Former   Smokeless tobacco: Never  Scientific laboratory technician Use: Never used  Substance Use Topics   Alcohol use: No   Drug use: No     Allergies   Patient has no known allergies.   Review of Systems Review of Systems See HPI  Physical Exam Triage Vital Signs ED Triage  Vitals  Enc Vitals Group     BP 05/30/22 0812 (!) 121/90     Pulse Rate 05/30/22 0812 60     Resp 05/30/22 0812 17     Temp 05/30/22 0812 98.9 F (37.2 C)     Temp Source 05/30/22 0812 Oral     SpO2 05/30/22 0812 97 %     Weight --      Height --      Head Circumference --      Peak Flow --      Pain Score 05/30/22 0814 0     Pain Loc --      Pain Edu? --      Excl. in [place]? --    No data found.  Updated Vital Signs BP (!) 121/90 (BP Location: Right Arm)   Pulse 60   Temp 98.9 F (37.2 C) (Oral)   Resp 17   LMP 05/09/2022   SpO2 97%   Visual Acuity Right Eye Distance:   Left Eye Distance:   Bilateral Distance:    Right Eye Near:   Left Eye Near:    Bilateral Near:     Physical Exam Constitutional:      General: She is not in acute distress.    Appearance: She is well-developed. She is ill-appearing.  HENT:     Head: Normocephalic and atraumatic.  Eyes:     Conjunctiva/sclera: Conjunctivae normal.     Pupils: Pupils are equal,  round, and reactive to light.  Cardiovascular:     Rate and Rhythm: Normal rate.  Pulmonary:     Effort: Pulmonary effort is normal. No respiratory distress.  Abdominal:     General: There is no distension.     Palpations: Abdomen is soft.  Musculoskeletal:        General: Normal range of motion.     Cervical back: Normal range of motion.  Skin:    General: Skin is warm and dry.  Neurological:     Mental Status: She is alert.      UC Treatments / Results  Labs (all labs ordered are listed, but only abnormal results are displayed) Labs Reviewed  POC SARS CORONAVIRUS 2 AG -  ED  POCT INFLUENZA A/B    EKG   Radiology No results found.  Procedures Procedures (including critical care time)  Medications Ordered in UC Medications - No data to display  Initial Impression / Assessment and Plan / UC Course  I have reviewed the triage vital signs and the nursing notes.  Pertinent labs & imaging results that were available during my care of the patient were reviewed by me and considered in my medical decision making (see chart for details).     Symptomatic care recomended Final Clinical Impressions(s) / UC Diagnoses   Final diagnoses:  Influenza-like illness     Discharge Instructions      Flu testing and COVID testing are both negative This is an upper respiratory virus Continue your Augmentin for sinus infection.  May use over-the-counter cough and cold medicine as needed.  Take Tylenol or ibuprofen for pain and fever May return to work when you have had no fever for 24 hours and your symptoms have improved     ED Prescriptions   None    PDMP not reviewed this encounter.   Raylene Everts, MD 05/30/22 [id number]

## 2022-05-30 NOTE — Discharge Instructions (Signed)
Flu testing and COVID testing are both negative This is an upper respiratory virus Continue your Augmentin for sinus infection.  May use over-the-counter cough and cold medicine as needed.  Take Tylenol or ibuprofen for pain and fever May return to work when you have had no fever for 24 hours and your symptoms have improved

## 2022-05-31 ENCOUNTER — Telehealth: Payer: Self-pay | Admitting: Emergency Medicine

## 2022-05-31 NOTE — Telephone Encounter (Signed)
LMTRC.  Advised if doing well to disregard the call.  Any questions or concerns, feel free to contact the office.
